# Patient Record
Sex: Male | Born: 1960 | Race: White | Hispanic: No | Smoking: Current every day smoker
Health system: Southern US, Community
[De-identification: ages and names within clinical notes are randomized; demographics above are authoritative.]

## PROBLEM LIST (undated history)

## (undated) DIAGNOSIS — M545 Low back pain, unspecified: Secondary | ICD-10-CM

## (undated) DIAGNOSIS — E042 Nontoxic multinodular goiter: Secondary | ICD-10-CM

## (undated) DIAGNOSIS — F419 Anxiety disorder, unspecified: Secondary | ICD-10-CM

## (undated) DIAGNOSIS — K769 Liver disease, unspecified: Secondary | ICD-10-CM

## (undated) DIAGNOSIS — I10 Essential (primary) hypertension: Secondary | ICD-10-CM

## (undated) DIAGNOSIS — F32A Depression, unspecified: Secondary | ICD-10-CM

## (undated) HISTORY — DX: Depression, unspecified: F32.A

## (undated) HISTORY — DX: Anxiety disorder, unspecified: F41.9

## (undated) HISTORY — DX: Nontoxic multinodular goiter: E04.2

## (undated) HISTORY — DX: Low back pain, unspecified: M54.50

---

## 2017-02-18 ENCOUNTER — Encounter (HOSPITAL_COMMUNITY): Payer: Self-pay | Admitting: Cardiology

## 2017-02-18 ENCOUNTER — Emergency Department (HOSPITAL_COMMUNITY)
Admission: EM | Admit: 2017-02-18 | Discharge: 2017-02-18 | Discharge status: Against Medical Advice | Payer: BC Managed Care – PPO | Attending: Emergency Medicine | Admitting: Emergency Medicine

## 2017-02-18 ENCOUNTER — Emergency Department (HOSPITAL_COMMUNITY): Payer: BC Managed Care – PPO

## 2017-02-18 DIAGNOSIS — I10 Essential (primary) hypertension: Secondary | ICD-10-CM | POA: Diagnosis not present

## 2017-02-18 DIAGNOSIS — R200 Anesthesia of skin: Secondary | ICD-10-CM | POA: Insufficient documentation

## 2017-02-18 DIAGNOSIS — Z5329 Procedure and treatment not carried out because of patient's decision for other reasons: Secondary | ICD-10-CM | POA: Insufficient documentation

## 2017-02-18 DIAGNOSIS — R51 Headache: Secondary | ICD-10-CM | POA: Insufficient documentation

## 2017-02-18 DIAGNOSIS — R55 Syncope and collapse: Secondary | ICD-10-CM | POA: Insufficient documentation

## 2017-02-18 DIAGNOSIS — R202 Paresthesia of skin: Secondary | ICD-10-CM | POA: Diagnosis not present

## 2017-02-18 DIAGNOSIS — F419 Anxiety disorder, unspecified: Secondary | ICD-10-CM | POA: Diagnosis not present

## 2017-02-18 DIAGNOSIS — R079 Chest pain, unspecified: Secondary | ICD-10-CM | POA: Insufficient documentation

## 2017-02-18 DIAGNOSIS — F17210 Nicotine dependence, cigarettes, uncomplicated: Secondary | ICD-10-CM | POA: Diagnosis not present

## 2017-02-18 DIAGNOSIS — R42 Dizziness and giddiness: Secondary | ICD-10-CM | POA: Insufficient documentation

## 2017-02-18 DIAGNOSIS — R002 Palpitations: Secondary | ICD-10-CM | POA: Insufficient documentation

## 2017-02-18 HISTORY — DX: Essential (primary) hypertension: I10

## 2017-02-18 LAB — COMPREHENSIVE METABOLIC PANEL
ALBUMIN: 4.1 g/dL (ref 3.5–5.0)
ALT: 26 U/L (ref 17–63)
ANION GAP: 10 (ref 5–15)
AST: 35 U/L (ref 15–41)
Alkaline Phosphatase: 36 U/L — ABNORMAL LOW (ref 38–126)
BUN: 7 mg/dL (ref 6–20)
CHLORIDE: 101 mmol/L (ref 101–111)
CO2: 24 mmol/L (ref 22–32)
Calcium: 9.3 mg/dL (ref 8.9–10.3)
Creatinine, Ser: 0.72 mg/dL (ref 0.61–1.24)
GFR calc Af Amer: >60 mL/min (ref >60)
GFR calc non Af Amer: >60 mL/min (ref >60)
GLUCOSE: 106 mg/dL — AB (ref 65–99)
POTASSIUM: 4.2 mmol/L (ref 3.5–5.1)
SODIUM: 135 mmol/L (ref 135–145)
Total Bilirubin: 0.6 mg/dL (ref 0.3–1.2)
Total Protein: 6.8 g/dL (ref 6.5–8.1)

## 2017-02-18 LAB — CBC WITH DIFFERENTIAL/PLATELET
Basophils Absolute: 0 10*3/uL (ref 0.0–0.1)
Basophils Relative: 1 %
EOS PCT: 2 %
Eosinophils Absolute: 0.1 10*3/uL (ref 0.0–0.7)
HEMATOCRIT: 41.1 % (ref 39.0–52.0)
HEMOGLOBIN: 14.5 g/dL (ref 13.0–17.0)
LYMPHS ABS: 1.1 10*3/uL (ref 0.7–4.0)
LYMPHS PCT: 26 %
MCH: 34.7 pg — AB (ref 26.0–34.0)
MCHC: 35.3 g/dL (ref 30.0–36.0)
MCV: 98.3 fL (ref 78.0–100.0)
Monocytes Absolute: 0.4 10*3/uL (ref 0.1–1.0)
Monocytes Relative: 9 %
Neutro Abs: 2.6 10*3/uL (ref 1.7–7.7)
Neutrophils Relative %: 62 %
PLATELETS: 196 10*3/uL (ref 150–400)
RBC: 4.18 MIL/uL — AB (ref 4.22–5.81)
RDW: 12.6 % (ref 11.5–15.5)
WBC: 4.2 10*3/uL (ref 4.0–10.5)

## 2017-02-18 LAB — RAPID URINE DRUG SCREEN, HOSP PERFORMED
AMPHETAMINES: NOT DETECTED
BARBITURATES: NOT DETECTED
Benzodiazepines: NOT DETECTED
Cocaine: NOT DETECTED
Opiates: NOT DETECTED
TETRAHYDROCANNABINOL: NOT DETECTED

## 2017-02-18 LAB — TROPONIN I: Troponin I: <0.03 ng/mL (ref <0.03)

## 2017-02-18 NOTE — ED Provider Notes (Signed)
[facility]-EMERGENCY DEPT Provider Note   CSN: [id number] Arrival date & time: 02/18/17  1004     History   Chief Complaint Chief Complaint  Patient presents with  . Chest Pain    HPI Clifford Hunt is a 56 y.o. male.  HPI Patient presents with chest pain. States over last week he had 2 episodes where he almost passed out. Both were however when he was working outside. Had lightheadedness and dizziness. Also has had chest pain. States his in his mid chest. It is dull. States it feels as if he got hit there but he has not had any trauma to the area. No personal history of cardiac disease but states his father had his first heart attack at 4238 and died at 1968. Patient has had a stress test but was 7 years ago. Also had some numbness and tingling in his left arm. States felt that potentially was not working quite as well. Also has had headaches. History of headaches. States it was somewhat worse recently. He does smoke. Past Medical History:  Diagnosis Date  . Hypertension     There are no active problems to display for this patient.   History reviewed. No pertinent surgical history.     Home Medications    Prior to Admission medications   Not on File    Family History History reviewed. No pertinent family history.  Social History Social History  Substance Use Topics  . Smoking status: Current Every Day Smoker    Packs/day: 1.00    Types: Cigarettes  . Smokeless tobacco: Never Used  . Alcohol use Yes     Comment: social     Allergies   Morphine and related   Review of Systems Review of Systems  Constitutional: Negative for appetite change and fever.  HENT: Negative for congestion.   Respiratory: Negative for shortness of breath.   Cardiovascular: Positive for chest pain.  Gastrointestinal: Negative for abdominal pain.  Genitourinary: Negative for flank pain.  Musculoskeletal: Negative for back pain.  Neurological: Positive for dizziness, numbness and  headaches.  Hematological: Negative for adenopathy.  Psychiatric/Behavioral: The patient is nervous/anxious.      Physical Exam Updated Vital Signs BP (!) 147/99 (BP Location: Left Arm)   Pulse 82   Temp 97.9 F (36.6 C) (Oral)   Resp 18   Ht 5\' 10"  (1.778 m)   Wt 85.7 kg (189 lb)   SpO2 97%   BMI 27.12 kg/m   Physical Exam  Constitutional: He appears well-developed.  HENT:  Head: Atraumatic.  Eyes: Pupils are equal, round, and reactive to light.  Neck: Neck supple.  Cardiovascular: Normal rate.   Pulmonary/Chest: He exhibits tenderness.  Tenderness on anterior mid upper chest. No deformity.  Abdominal: There is no tenderness.  Musculoskeletal:   good grip strength bilaterally. Sensation  grossly intact in both upper extremity's. Face symmetric.  Neurological: He is alert.  Skin: Skin is warm. Capillary refill takes less than 2 seconds.     ED Treatments / Results  Labs (all labs ordered are listed, but only abnormal results are displayed) Labs Reviewed  CBC WITH DIFFERENTIAL/PLATELET - Abnormal; Notable for the following:       Result Value   RBC 4.18 (*)    MCH 34.7 (*)    All other components within normal limits  COMPREHENSIVE METABOLIC PANEL - Abnormal; Notable for the following:    Glucose, Bld 106 (*)    Alkaline Phosphatase 36 (*)    All other  components within normal limits  RAPID URINE DRUG SCREEN, HOSP PERFORMED  TROPONIN I    EKG  EKG Interpretation  Date/Time:  Wednesday February 18 2017 10:16:03 EDT Ventricular Rate:  89 PR Interval:    QRS Duration: 96 QT Interval:  355 QTC Calculation: 432 R Axis:   72 Text Interpretation:  Sinus rhythm Baseline wander in lead(s) I II III aVR aVL aVF Confirmed by Benjiman Core [phone]) on 02/18/2017 10:20:31 AM       Radiology No results found.  Procedures Procedures (including critical care time)  Medications Ordered in ED Medications - No data to display   Initial Impression / Assessment  and Plan / ED Course  I have reviewed the triage vital signs and the nursing notes.  Pertinent labs & imaging results that were available during my care of the patient were reviewed by me and considered in my medical decision making (see chart for details).     Patient with palpitations. No real chest pain with it. No lightheadedness or dizziness. Mid chest. He is worried because of a strong family history. EKG x-ray and labs reassuring. Will however need cardiology. Will discharge home.  Final Clinical Impressions(s) / ED Diagnoses   Final diagnoses:  Chest pain, unspecified type    New Prescriptions There are no discharge medications for this patient.    Benjiman Core, MD 02/21/17 [phone]

## 2017-02-18 NOTE — ED Triage Notes (Signed)
Left arm numbness ,  Chest soreness times 2 days.  States he has had 2  near syncopal episodes in the  last week.

## 2017-02-18 NOTE — ED Notes (Signed)
EKG given to Dr Pickering 

## 2017-02-18 NOTE — ED Notes (Signed)
Pt made aware to return if symptoms worsen or if any life threatening symptoms occur.   

## 2017-02-18 NOTE — Discharge Instructions (Signed)
Take a baby aspirin every day. You're leaving against our advice. Feel free to return any time for further treatment. Follow-up as it is possible with your doctor.

## 2017-03-06 ENCOUNTER — Other Ambulatory Visit (HOSPITAL_COMMUNITY): Payer: Self-pay | Admitting: Family Medicine

## 2017-03-06 DIAGNOSIS — Z720 Tobacco use: Secondary | ICD-10-CM

## 2017-04-03 ENCOUNTER — Ambulatory Visit (HOSPITAL_COMMUNITY)
Admission: RE | Admit: 2017-04-03 | Discharge: 2017-04-03 | Disposition: A | Discharge status: Home / Self Care | Payer: BC Managed Care – PPO | Source: Ambulatory Visit | Attending: Family Medicine | Admitting: Family Medicine

## 2017-04-03 DIAGNOSIS — Z122 Encounter for screening for malignant neoplasm of respiratory organs: Secondary | ICD-10-CM | POA: Insufficient documentation

## 2017-04-03 DIAGNOSIS — E041 Nontoxic single thyroid nodule: Secondary | ICD-10-CM | POA: Diagnosis not present

## 2017-04-03 DIAGNOSIS — F17210 Nicotine dependence, cigarettes, uncomplicated: Secondary | ICD-10-CM | POA: Diagnosis present

## 2017-04-03 DIAGNOSIS — I700 Atherosclerosis of aorta: Secondary | ICD-10-CM | POA: Insufficient documentation

## 2017-04-03 DIAGNOSIS — Z720 Tobacco use: Secondary | ICD-10-CM

## 2017-04-16 ENCOUNTER — Other Ambulatory Visit (HOSPITAL_BASED_OUTPATIENT_CLINIC_OR_DEPARTMENT_OTHER): Payer: Self-pay

## 2017-04-16 DIAGNOSIS — R0683 Snoring: Secondary | ICD-10-CM

## 2017-04-16 DIAGNOSIS — G4730 Sleep apnea, unspecified: Secondary | ICD-10-CM

## 2017-04-16 DIAGNOSIS — G4710 Hypersomnia, unspecified: Secondary | ICD-10-CM

## 2017-04-16 DIAGNOSIS — R61 Generalized hyperhidrosis: Secondary | ICD-10-CM

## 2019-05-08 HISTORY — PX: ESOPHAGOGASTRODUODENOSCOPY: SHX1529

## 2019-05-08 HISTORY — PX: COLONOSCOPY: SHX174

## 2020-03-30 ENCOUNTER — Other Ambulatory Visit: Payer: Self-pay

## 2020-03-30 ENCOUNTER — Inpatient Hospital Stay (HOSPITAL_COMMUNITY)
Admission: EM | Admit: 2020-03-30 | Discharge: 2020-04-11 | DRG: 640 | Disposition: A | Discharge status: Skilled Nursing Facility | Payer: Medicare PPO | Attending: Family Medicine | Admitting: Family Medicine

## 2020-03-30 ENCOUNTER — Encounter (HOSPITAL_COMMUNITY): Payer: Self-pay | Admitting: *Deleted

## 2020-03-30 DIAGNOSIS — Z885 Allergy status to narcotic agent status: Secondary | ICD-10-CM | POA: Diagnosis not present

## 2020-03-30 DIAGNOSIS — D509 Iron deficiency anemia, unspecified: Secondary | ICD-10-CM | POA: Diagnosis present

## 2020-03-30 DIAGNOSIS — K828 Other specified diseases of gallbladder: Secondary | ICD-10-CM | POA: Diagnosis present

## 2020-03-30 DIAGNOSIS — D649 Anemia, unspecified: Secondary | ICD-10-CM

## 2020-03-30 DIAGNOSIS — U071 COVID-19: Secondary | ICD-10-CM | POA: Diagnosis present

## 2020-03-30 DIAGNOSIS — R7989 Other specified abnormal findings of blood chemistry: Secondary | ICD-10-CM | POA: Diagnosis present

## 2020-03-30 DIAGNOSIS — D696 Thrombocytopenia, unspecified: Secondary | ICD-10-CM

## 2020-03-30 DIAGNOSIS — R0602 Shortness of breath: Secondary | ICD-10-CM

## 2020-03-30 DIAGNOSIS — F1020 Alcohol dependence, uncomplicated: Secondary | ICD-10-CM | POA: Diagnosis present

## 2020-03-30 DIAGNOSIS — R269 Unspecified abnormalities of gait and mobility: Secondary | ICD-10-CM | POA: Diagnosis present

## 2020-03-30 DIAGNOSIS — Z79899 Other long term (current) drug therapy: Secondary | ICD-10-CM

## 2020-03-30 DIAGNOSIS — J90 Pleural effusion, not elsewhere classified: Secondary | ICD-10-CM | POA: Diagnosis present

## 2020-03-30 DIAGNOSIS — I10 Essential (primary) hypertension: Secondary | ICD-10-CM | POA: Diagnosis present

## 2020-03-30 DIAGNOSIS — L299 Pruritus, unspecified: Secondary | ICD-10-CM | POA: Diagnosis not present

## 2020-03-30 DIAGNOSIS — Z8249 Family history of ischemic heart disease and other diseases of the circulatory system: Secondary | ICD-10-CM

## 2020-03-30 DIAGNOSIS — E876 Hypokalemia: Secondary | ICD-10-CM | POA: Diagnosis not present

## 2020-03-30 DIAGNOSIS — E871 Hypo-osmolality and hyponatremia: Principal | ICD-10-CM

## 2020-03-30 DIAGNOSIS — R809 Proteinuria, unspecified: Secondary | ICD-10-CM | POA: Diagnosis present

## 2020-03-30 DIAGNOSIS — D61818 Other pancytopenia: Secondary | ICD-10-CM | POA: Diagnosis present

## 2020-03-30 DIAGNOSIS — K7031 Alcoholic cirrhosis of liver with ascites: Secondary | ICD-10-CM | POA: Diagnosis present

## 2020-03-30 DIAGNOSIS — F32A Depression, unspecified: Secondary | ICD-10-CM | POA: Diagnosis present

## 2020-03-30 DIAGNOSIS — J189 Pneumonia, unspecified organism: Secondary | ICD-10-CM

## 2020-03-30 DIAGNOSIS — F10139 Alcohol abuse with withdrawal, unspecified: Secondary | ICD-10-CM | POA: Diagnosis present

## 2020-03-30 DIAGNOSIS — E878 Other disorders of electrolyte and fluid balance, not elsewhere classified: Secondary | ICD-10-CM | POA: Diagnosis present

## 2020-03-30 DIAGNOSIS — F17210 Nicotine dependence, cigarettes, uncomplicated: Secondary | ICD-10-CM | POA: Diagnosis present

## 2020-03-30 DIAGNOSIS — R630 Anorexia: Secondary | ICD-10-CM | POA: Diagnosis present

## 2020-03-30 DIAGNOSIS — T458X5A Adverse effect of other primarily systemic and hematological agents, initial encounter: Secondary | ICD-10-CM | POA: Diagnosis not present

## 2020-03-30 DIAGNOSIS — F1010 Alcohol abuse, uncomplicated: Secondary | ICD-10-CM

## 2020-03-30 DIAGNOSIS — L500 Allergic urticaria: Secondary | ICD-10-CM | POA: Diagnosis not present

## 2020-03-30 DIAGNOSIS — K7030 Alcoholic cirrhosis of liver without ascites: Secondary | ICD-10-CM

## 2020-03-30 DIAGNOSIS — D508 Other iron deficiency anemias: Secondary | ICD-10-CM | POA: Diagnosis not present

## 2020-03-30 DIAGNOSIS — K769 Liver disease, unspecified: Secondary | ICD-10-CM

## 2020-03-30 HISTORY — DX: Liver disease, unspecified: K76.9

## 2020-03-30 LAB — CBC WITH DIFFERENTIAL/PLATELET
Abs Immature Granulocytes: 0.1 10*3/uL — ABNORMAL HIGH (ref 0.00–0.07)
Basophils Absolute: 0 10*3/uL (ref 0.0–0.1)
Basophils Relative: 0 %
Eosinophils Absolute: 0 10*3/uL (ref 0.0–0.5)
Eosinophils Relative: 0 %
HCT: 29.6 % — ABNORMAL LOW (ref 39.0–52.0)
Hemoglobin: 10.7 g/dL — ABNORMAL LOW (ref 13.0–17.0)
Immature Granulocytes: 1 %
Lymphocytes Relative: 10 %
Lymphs Abs: 0.7 10*3/uL (ref 0.7–4.0)
MCH: 39.2 pg — ABNORMAL HIGH (ref 26.0–34.0)
MCHC: 36.1 g/dL — ABNORMAL HIGH (ref 30.0–36.0)
MCV: 108.4 fL — ABNORMAL HIGH (ref 80.0–100.0)
Monocytes Absolute: 0.8 10*3/uL (ref 0.1–1.0)
Monocytes Relative: 11 %
Neutro Abs: 5.8 10*3/uL (ref 1.7–7.7)
Neutrophils Relative %: 78 %
Platelets: 121 10*3/uL — ABNORMAL LOW (ref 150–400)
RBC: 2.73 MIL/uL — ABNORMAL LOW (ref 4.22–5.81)
RDW: 12.2 % (ref 11.5–15.5)
WBC: 7.4 10*3/uL (ref 4.0–10.5)
nRBC: 0 % (ref 0.0–0.2)

## 2020-03-30 LAB — COMPREHENSIVE METABOLIC PANEL
ALT: 49 U/L — ABNORMAL HIGH (ref 0–44)
AST: 113 U/L — ABNORMAL HIGH (ref 15–41)
Albumin: 2.6 g/dL — ABNORMAL LOW (ref 3.5–5.0)
Alkaline Phosphatase: 142 U/L — ABNORMAL HIGH (ref 38–126)
Anion gap: 12 (ref 5–15)
BUN: 5 mg/dL — ABNORMAL LOW (ref 6–20)
CO2: 20 mmol/L — ABNORMAL LOW (ref 22–32)
Calcium: 8.1 mg/dL — ABNORMAL LOW (ref 8.9–10.3)
Chloride: 82 mmol/L — ABNORMAL LOW (ref 98–111)
Creatinine, Ser: 0.45 mg/dL — ABNORMAL LOW (ref 0.61–1.24)
GFR calc Af Amer: >60 mL/min (ref >60)
GFR calc non Af Amer: >60 mL/min (ref >60)
Glucose, Bld: 108 mg/dL — ABNORMAL HIGH (ref 70–99)
Potassium: 3.9 mmol/L (ref 3.5–5.1)
Sodium: 114 mmol/L — CL (ref 135–145)
Total Bilirubin: 2 mg/dL — ABNORMAL HIGH (ref 0.3–1.2)
Total Protein: 6.5 g/dL (ref 6.5–8.1)

## 2020-03-30 LAB — BASIC METABOLIC PANEL
Anion gap: 11 (ref 5–15)
Anion gap: 10 (ref 5–15)
BUN: 5 mg/dL — ABNORMAL LOW (ref 6–20)
BUN: 5 mg/dL — ABNORMAL LOW (ref 6–20)
CO2: 22 mmol/L (ref 22–32)
CO2: 22 mmol/L (ref 22–32)
Calcium: 7.8 mg/dL — ABNORMAL LOW (ref 8.9–10.3)
Calcium: 7.9 mg/dL — ABNORMAL LOW (ref 8.9–10.3)
Chloride: 86 mmol/L — ABNORMAL LOW (ref 98–111)
Chloride: 89 mmol/L — ABNORMAL LOW (ref 98–111)
Creatinine, Ser: 0.41 mg/dL — ABNORMAL LOW (ref 0.61–1.24)
Creatinine, Ser: 0.41 mg/dL — ABNORMAL LOW (ref 0.61–1.24)
GFR calc Af Amer: >60 mL/min (ref >60)
GFR calc Af Amer: >60 mL/min (ref >60)
GFR calc non Af Amer: >60 mL/min (ref >60)
GFR calc non Af Amer: >60 mL/min (ref >60)
Glucose, Bld: 93 mg/dL (ref 70–99)
Glucose, Bld: 104 mg/dL — ABNORMAL HIGH (ref 70–99)
Potassium: 3.5 mmol/L (ref 3.5–5.1)
Potassium: 3.5 mmol/L (ref 3.5–5.1)
Sodium: 119 mmol/L — CL (ref 135–145)
Sodium: 121 mmol/L — ABNORMAL LOW (ref 135–145)

## 2020-03-30 LAB — ETHANOL: Alcohol, Ethyl (B): 72 mg/dL — ABNORMAL HIGH (ref <10)

## 2020-03-30 LAB — LIPASE, BLOOD: Lipase: 40 U/L (ref 11–51)

## 2020-03-30 LAB — SARS CORONAVIRUS 2 BY RT PCR (HOSPITAL ORDER, PERFORMED IN ~~LOC~~ HOSPITAL LAB): SARS Coronavirus 2: NEGATIVE

## 2020-03-30 LAB — LACTIC ACID, PLASMA: Lactic Acid, Venous: 2.1 mmol/L (ref 0.5–1.9)

## 2020-03-30 LAB — PROTIME-INR
INR: 1.1 (ref 0.8–1.2)
Prothrombin Time: 13.4 seconds (ref 11.4–15.2)

## 2020-03-30 LAB — ACETAMINOPHEN LEVEL: Acetaminophen (Tylenol), Serum: <10 ug/mL — ABNORMAL LOW (ref 10–30)

## 2020-03-30 MED ORDER — THIAMINE HCL 100 MG/ML IJ SOLN: 100.0000 mg | Freq: Every day | INTRAMUSCULAR | Status: DC

## 2020-03-30 MED ORDER — THIAMINE HCL 100 MG/ML IJ SOLN
100.0000 mg | Freq: Every day | INTRAMUSCULAR | Status: DC
  Administered 2020-03-30: 100 mg via INTRAVENOUS
  Filled 2020-03-30 (×4): qty 2

## 2020-03-30 MED ORDER — ONDANSETRON HCL 4 MG PO TABS: 4.0000 mg | ORAL_TABLET | Freq: Four times a day (QID) | ORAL | Status: DC | PRN

## 2020-03-30 MED ORDER — PANTOPRAZOLE SODIUM 40 MG IV SOLR
40.0000 mg | INTRAVENOUS | Status: AC
  Administered 2020-03-30: 40 mg via INTRAVENOUS
  Filled 2020-03-30: qty 40

## 2020-03-30 MED ORDER — THIAMINE HCL 100 MG PO TABS
100.0000 mg | ORAL_TABLET | Freq: Every day | ORAL | Status: DC
  Administered 2020-03-31 – 2020-04-11 (×12): 100 mg via ORAL
  Filled 2020-03-30 (×12): qty 1

## 2020-03-30 MED ORDER — LORAZEPAM 1 MG PO TABS: 0.0000 mg | ORAL_TABLET | Freq: Two times a day (BID) | ORAL | Status: AC

## 2020-03-30 MED ORDER — PAROXETINE HCL 20 MG PO TABS
20.0000 mg | ORAL_TABLET | Freq: Every day | ORAL | Status: DC
  Administered 2020-03-31 – 2020-04-07 (×8): 20 mg via ORAL
  Filled 2020-03-30 (×8): qty 1

## 2020-03-30 MED ORDER — ORAL CARE MOUTH RINSE
15.0000 mL | Freq: Two times a day (BID) | OROMUCOSAL | Status: DC
  Administered 2020-03-30 – 2020-04-11 (×18): 15 mL via OROMUCOSAL

## 2020-03-30 MED ORDER — LORAZEPAM 2 MG/ML IJ SOLN
0.0000 mg | Freq: Two times a day (BID) | INTRAMUSCULAR | Status: AC
  Administered 2020-04-02: 2 mg via INTRAVENOUS
  Filled 2020-03-30: qty 1

## 2020-03-30 MED ORDER — ONDANSETRON HCL 4 MG/2ML IJ SOLN
4.0000 mg | Freq: Four times a day (QID) | INTRAMUSCULAR | Status: DC | PRN
  Administered 2020-04-04: 4 mg via INTRAVENOUS
  Filled 2020-03-30: qty 2

## 2020-03-30 MED ORDER — LORAZEPAM 1 MG PO TABS
0.0000 mg | ORAL_TABLET | Freq: Four times a day (QID) | ORAL | Status: AC
  Administered 2020-04-01: 1 mg via ORAL
  Filled 2020-03-30: qty 1

## 2020-03-30 MED ORDER — SODIUM CHLORIDE 0.9 % IV SOLN: INTRAVENOUS | Status: AC

## 2020-03-30 MED ORDER — NICOTINE 14 MG/24HR TD PT24
14.0000 mg | MEDICATED_PATCH | Freq: Every day | TRANSDERMAL | Status: DC
  Administered 2020-03-30 – 2020-04-11 (×13): 14 mg via TRANSDERMAL
  Filled 2020-03-30 (×13): qty 1

## 2020-03-30 MED ORDER — LORAZEPAM 2 MG/ML IJ SOLN
0.0000 mg | Freq: Four times a day (QID) | INTRAMUSCULAR | Status: AC
  Administered 2020-03-30: 19:00:00 1 mg via INTRAVENOUS
  Filled 2020-03-30: qty 1

## 2020-03-30 MED ORDER — FOLIC ACID 5 MG/ML IJ SOLN
1.0000 mg | Freq: Every day | INTRAMUSCULAR | Status: DC
  Administered 2020-03-30 – 2020-03-31 (×2): 1 mg via INTRAVENOUS
  Filled 2020-03-30 (×3): qty 0.2

## 2020-03-30 MED ORDER — SODIUM CHLORIDE 0.9 % IV SOLN: INTRAVENOUS | Status: DC

## 2020-03-30 MED ORDER — ENSURE ENLIVE PO LIQD
237.0000 mL | Freq: Two times a day (BID) | ORAL | Status: DC
  Administered 2020-03-31 – 2020-04-11 (×15): 237 mL via ORAL
  Filled 2020-03-30 (×5): qty 237

## 2020-03-30 NOTE — H&P (Signed)
History and Physical  Clifford Hunt EXH:[id number] DOB: Nov 09, 1960 DOA: 03/30/2020  Referring physician: Dr Hyacinth Meeker, ED physician PCP: [facility]  Outpatient Specialists:   Patient Coming From: home  Chief Complaint: abnormal lab  HPI: Clifford Hunt is a 59 y.o. male with a history of alcoholism with 5-6 beers a day with little to no oral food intake.  Patient was seen at [facility] and had blood work, which showed hyponatremia.  The patient was called by his PCPs office and instructed to come to the emergency room for evaluation.  He reports feeling weak with little to no energy.  No palliating or provoking factors.  His last alcohol consumption was earlier today: He had 2 beers prior to getting up.  He does report some nausea and lack of appetite.  He does have a longstanding history of foods not tasting right, which affects his appetite.  His symptoms are unchanged.  He does bruise easily, especially in his arms and hands.  He has no swelling in his legs.  Emergency Department Course: Sodium was 114 with a hypochloremia.  He was started on IV fluids at 250 mL/h.  He received 500 mL by the time I saw the patient.  Review of Systems:   Pt denies any fevers, chills, vomiting, diarrhea, constipation, abdominal pain, shortness of breath, dyspnea on exertion, orthopnea, cough, wheezing, palpitations, headache, vision changes, lightheadedness, dizziness, melena, rectal bleeding.  Review of systems are otherwise negative  Past Medical History:  Diagnosis Date  . Hypertension   . Liver damage    No past surgical history on file. Social History:  reports that he has been smoking cigarettes. He has been smoking about 1.00 pack per day. He has never used smokeless tobacco. He reports current alcohol use. He reports that he does not use drugs. Patient lives at home  Allergies  Allergen Reactions  . Morphine And Related Shortness Of Breath    No  family history on file.  Family history reviewed  Prior to Admission medications   Not on File    Physical Exam: BP (!) 142/79   Pulse 97   Temp 97.7 F (36.5 C)   Resp 15   Ht 5\' 10"  (1.778 m)   Wt 62.6 kg   SpO2 100%   BMI 19.80 kg/m   . General: Adult male who appears older than stated age. Awake and alert and oriented x3. No acute cardiopulmonary distress.  HEENT: Normocephalic atraumatic.  Right and left ears normal in appearance.  Pupils equal, round, reactive to light. Extraocular muscles are intact. Sclerae anicteric and noninjected.  Moist mucosal membranes. No mucosal lesions.  . Neck: Neck supple without lymphadenopathy. No carotid bruits. No masses palpated.  . Cardiovascular: Regular rate with normal S1-S2 sounds. No murmurs, rubs, gallops auscultated. No JVD.  Marland Kitchen Respiratory: Good respiratory effort with no wheezes, rales, rhonchi. Lungs clear to auscultation bilaterally.  No accessory muscle use. . Abdomen: Soft, nontender, nondistended. Active bowel sounds. No masses.  Does have enlarged liver spanning 3 to 4 fingerbreadths past the costal margin. . Skin: Significant bruising to the upper extremities bilaterally.  No rashes, lesions, or ulcerations.  Dry, warm to touch. 2+ dorsalis pedis and radial pulses. . Musculoskeletal: No calf or leg pain. All major joints not erythematous nontender.  No upper or lower joint deformation.  Good ROM.  No contractures  . Psychiatric: Intact judgment and insight. Pleasant and cooperative. . Neurologic: No focal neurological deficits.  Strength is 5/5 and symmetric in upper and lower extremities.  Cranial nerves II through XII are grossly intact.           Labs on Admission: I have personally reviewed following labs and imaging studies  CBC: Recent Labs  Lab 03/30/20 1256  WBC 7.4  NEUTROABS 5.8  HGB 10.7*  HCT 29.6*  MCV 108.4*  PLT 121*   Basic Metabolic Panel: Recent Labs  Lab 03/30/20 1256 03/30/20 1536  NA 114*  119*  K 3.9 3.5  CL 82* 86*  CO2 20* 22  GLUCOSE 108* 93  BUN 5* 5*  CREATININE 0.45* 0.41*  CALCIUM 8.1* 7.8*   GFR: Estimated Creatinine Clearance: 89.1 mL/min (A) (by C-G formula based on SCr of 0.41 mg/dL (L)). Liver Function Tests: Recent Labs  Lab 03/30/20 1256  AST 113*  ALT 49*  ALKPHOS 142*  BILITOT 2.0*  PROT 6.5  ALBUMIN 2.6*   Recent Labs  Lab 03/30/20 1256  LIPASE 40   No results for input(s): AMMONIA in the last 168 hours. Coagulation Profile: No results for input(s): INR, PROTIME in the last 168 hours. Cardiac Enzymes: No results for input(s): CKTOTAL, CKMB, CKMBINDEX, TROPONINI in the last 168 hours. BNP (last 3 results) No results for input(s): PROBNP in the last 8760 hours. HbA1C: No results for input(s): HGBA1C in the last 72 hours. CBG: No results for input(s): GLUCAP in the last 168 hours. Lipid Profile: No results for input(s): CHOL, HDL, LDLCALC, TRIG, CHOLHDL, LDLDIRECT in the last 72 hours. Thyroid Function Tests: No results for input(s): TSH, T4TOTAL, FREET4, T3FREE, THYROIDAB in the last 72 hours. Anemia Panel: No results for input(s): VITAMINB12, FOLATE, FERRITIN, TIBC, IRON, RETICCTPCT in the last 72 hours. Urine analysis: No results found for: COLORURINE, APPEARANCEUR, LABSPEC, PHURINE, GLUCOSEU, HGBUR, BILIRUBINUR, KETONESUR, PROTEINUR, UROBILINOGEN, NITRITE, LEUKOCYTESUR Sepsis Labs: @LABRCNTIP (procalcitonin:4,lacticidven:4) ) Recent Results (from the past 240 hour(s))  SARS Coronavirus 2 by RT PCR (hospital order, performed in [facility] hospital lab) Nasopharyngeal Nasopharyngeal Swab     Status: None   Collection Time: 03/30/20  1:37 PM   Specimen: Nasopharyngeal Swab  Result Value Ref Range Status   SARS Coronavirus 2 NEGATIVE NEGATIVE Final    Comment: (NOTE) SARS-CoV-2 target nucleic acids are NOT DETECTED.  The SARS-CoV-2 RNA is generally detectable in upper and lower respiratory specimens during the acute phase of  infection. The lowest concentration of SARS-CoV-2 viral copies this assay can detect is 250 copies / mL. A negative result does not preclude SARS-CoV-2 infection and should not be used as the sole basis for treatment or other patient management decisions.  A negative result may occur with improper specimen collection / handling, submission of specimen other than nasopharyngeal swab, presence of viral mutation(s) within the areas targeted by this assay, and inadequate number of viral copies (<250 copies / mL). A negative result must be combined with clinical observations, patient history, and epidemiological information.  Fact Sheet for Patients:   04/01/20  Fact Sheet for Healthcare Providers: BoilerBrush.com.cy  This test is not yet approved or  cleared by the https://pope.com/ FDA and has been authorized for detection and/or diagnosis of SARS-CoV-2 by FDA under an Emergency Use Authorization (EUA).  This EUA will remain in effect (meaning this test can be used) for the duration of the COVID-19 declaration under Section 564(b)(1) of the Act, 21 U.S.C. section 360bbb-3(b)(1), unless the authorization is terminated or revoked sooner.  Performed at [facility], [address], [place], [place]  [address]      Radiological Exams on Admission: No results found.  EKG: Independently reviewed.  Sinus tachycardia with PVCs.  No acute ST changes.  Assessment/Plan: Principal Problem:   Acute hyponatremia Active Problems:   Elevated LFTs   Alcohol abuse   Anemia, iron deficiency    This patient was discussed with the ED physician, including pertinent vitals, physical exam findings, labs, and imaging.  We also discussed care given by the ED provider.  1. Acute hyponatremia a. Secondary to beer portal mania. b. Check urine sodium, urine creatinine, urine osmolality c. We will hold IV fluids after another 500 mL given at 100  mL/h. d. Fluid restrict e. Check sodium every 4 hours 2. Alcohol abuse a. Alcohol withdrawals protocol 3. Elevated LFTs a. 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 Right upper quadrant ultrasound c. Check INR which is likely elevated as exhibited by the patient's easy bruising 4. Anemia a. Secondary to chronic alcohol use and minimal oral intake.  DVT prophylaxis: SCDs for now until INR returns Consultants: None Code Status: Full code Family Communication: Fianc present during interview and exam Disposition Plan: Patient should be able to return home   Levie Heritage, DO

## 2020-03-30 NOTE — ED Triage Notes (Signed)
Sent for evaluation of labs. Chronic alcohol abuse

## 2020-03-30 NOTE — ED Provider Notes (Signed)
[facility] EMERGENCY DEPARTMENT Provider Note   CSN: [id number] Arrival date & time: 03/30/20  1206     History Chief Complaint  Patient presents with  . Abnormal Lab    Clifford Hunt is a 59 y.o. male.  HPI   Pt is a 59 y/o male, the patient states he only takes Paxil, he is a daily alcoholic, drinks heavy amounts of alcohol, has already had two beers this morning.  He is followed by [facility], he had lab work done recently and was told something was abnormal and that he needed to come to the hospital.  He is unaware of what that specific abnormality was.  He denies nausea or vomiting but feels generally weak.  Denies diarrhea or rectal bleeding.  The history is a little vague since the patient does not know why he is here only that he was told to come because of abnormal blood work.  Past Medical History:  Diagnosis Date  . Hypertension   . Liver damage     There are no problems to display for this patient.   No past surgical history on file.     No family history on file.  Social History   Tobacco Use  . Smoking status: Current Every Day Smoker    Packs/day: 1.00    Types: Cigarettes  . Smokeless tobacco: Never Used  Substance Use Topics  . Alcohol use: Yes    Comment: social  . Drug use: No    Home Medications Prior to Admission medications   Not on File    Allergies    Morphine and related  Review of Systems   Review of Systems  All other systems reviewed and are negative.   Physical Exam Updated Vital Signs BP (!) 142/79   Pulse 97   Temp 97.7 F (36.5 C)   Resp 15   Ht 1.778 m (5\' 10" )   Wt 62.6 kg   SpO2 100%   BMI 19.80 kg/m   Physical Exam Vitals and nursing note reviewed.  Constitutional:      General: He is not in acute distress.    Appearance: He is well-developed.  HENT:     Head: Normocephalic and atraumatic.     Mouth/Throat:     Mouth: Mucous membranes are moist.     Pharynx: No oropharyngeal exudate.   Eyes:     General: Scleral icterus present.        Right eye: No discharge.        Left eye: No discharge.     Pupils: Pupils are equal, round, and reactive to light.  Neck:     Thyroid: No thyromegaly.     Vascular: No JVD.  Cardiovascular:     Rate and Rhythm: Regular rhythm. Tachycardia present.     Heart sounds: Normal heart sounds. No murmur heard.  No friction rub. No gallop.   Pulmonary:     Effort: Pulmonary effort is normal. No respiratory distress.     Breath sounds: Normal breath sounds. No wheezing or rales.  Abdominal:     General: Bowel sounds are normal. There is no distension.     Palpations: Abdomen is soft. There is no mass.     Tenderness: There is no abdominal tenderness.  Musculoskeletal:        General: No tenderness. Normal range of motion.     Cervical back: Normal range of motion and neck supple.     Comments: Diffuse muscle wasting, no  edema of the legs  Lymphadenopathy:     Cervical: No cervical adenopathy.  Skin:    General: Skin is warm and dry.     Findings: No erythema or rash.  Neurological:     Mental Status: He is alert.     Coordination: Coordination normal.     Comments: The patient is diffusely weak, leaning over in the chair, able to move all four extremities but slowly  Psychiatric:        Behavior: Behavior normal.     ED Results / Procedures / Treatments   Labs (all labs ordered are listed, but only abnormal results are displayed) Labs Reviewed  COMPREHENSIVE METABOLIC PANEL - Abnormal; Notable for the following components:      Result Value   Sodium 114 (*)    Chloride 82 (*)    CO2 20 (*)    Glucose, Bld 108 (*)    BUN 5 (*)    Creatinine, Ser 0.45 (*)    Calcium 8.1 (*)    Albumin 2.6 (*)    AST 113 (*)    ALT 49 (*)    Alkaline Phosphatase 142 (*)    Total Bilirubin 2.0 (*)    All other components within normal limits  LACTIC ACID, PLASMA - Abnormal; Notable for the following components:   Lactic Acid, Venous 2.1  (*)    All other components within normal limits  ETHANOL - Abnormal; Notable for the following components:   Alcohol, Ethyl (B) 72 (*)    All other components within normal limits  CBC WITH DIFFERENTIAL/PLATELET - Abnormal; Notable for the following components:   RBC 2.73 (*)    Hemoglobin 10.7 (*)    HCT 29.6 (*)    MCV 108.4 (*)    MCH 39.2 (*)    MCHC 36.1 (*)    Platelets 121 (*)    Abs Immature Granulocytes 0.10 (*)    All other components within normal limits  ACETAMINOPHEN LEVEL - Abnormal; Notable for the following components:   Acetaminophen (Tylenol), Serum <10 (*)    All other components within normal limits  SARS CORONAVIRUS 2 BY RT PCR (HOSPITAL ORDER, PERFORMED IN [facility] LAB)  LIPASE, BLOOD  HEPATITIS PANEL, ACUTE    EKG EKG Interpretation  Date/Time:  Friday March 30 2020 12:30:30 EDT Ventricular Rate:  110 PR Interval:  132 QRS Duration: 78 QT Interval:  330 QTC Calculation: 446 R Axis:   104 Text Interpretation: Sinus tachycardia with Premature supraventricular complexes Rightward axis Borderline ECG Since last tracing rate faster and axis is now rightward Confirmed by Eber Hong ([id number]) on 03/30/2020 12:46:53 PM   Radiology No results found.  Procedures .Critical Care Performed by: Eber Hong, MD Authorized by: Eber Hong, MD   Critical care provider statement:    Critical care time (minutes):  35   Critical care time was exclusive of:  Separately billable procedures and treating other patients and teaching time   Critical care was necessary to treat or prevent imminent or life-threatening deterioration of the following conditions:  Metabolic crisis   Critical care was time spent personally by me on the following activities:  Blood draw for specimens, development of treatment plan with patient or surrogate, discussions with consultants, evaluation of patient's response to treatment, examination of patient, obtaining history  from patient or surrogate, ordering and performing treatments and interventions, ordering and review of laboratory studies, ordering and review of radiographic studies, pulse oximetry, re-evaluation of patient's condition and  review of old charts   (including critical care time)  Medications Ordered in ED Medications  0.9 %  sodium chloride infusion ( Intravenous New Bag/Given 03/30/20 1335)  LORazepam (ATIVAN) injection 0-4 mg (0 mg Intravenous Not Given 03/30/20 1337)    Or  LORazepam (ATIVAN) tablet 0-4 mg ( Oral See Alternative 03/30/20 1337)  LORazepam (ATIVAN) injection 0-4 mg (has no administration in time range)    Or  LORazepam (ATIVAN) tablet 0-4 mg (has no administration in time range)  thiamine tablet 100 mg (has no administration in time range)    Or  thiamine (B-1) injection 100 mg (has no administration in time range)  pantoprazole (PROTONIX) injection 40 mg (has no administration in time range)    ED Course  I have reviewed the triage vital signs and the nursing notes.  Pertinent labs & imaging results that were available during my care of the patient were reviewed by me and considered in my medical decision making (see chart for details).  Clinical Course as of Mar 30 1530  Fri Mar 30, 2020  1323 Hemoglobin(!): 10.7 [BM]  1323 WBC: 7.4 [BM]  1323 Patient likely has thrombocytopenia related to chronic alcohol use as is the anemia.  Last hemoglobin was over 14 checked several years ago.  Would not be surprised if there was some alcoholic gastritis, will need to have stools hemocculted, Protonix ordered  Platelets(!): 121 [BM]  1347 Sodium(!!): 114 [BM]  1347 Chloride(!): 82 [BM]  1347 Albumin(!): 2.6 [BM]  1347 AST(!): 113 [BM]  1347 ALT(!): 49 [BM]  1347 Total Bilirubin(!): 2.0 [BM]  1347 Alcohol, Ethyl (B)(!): 72 [BM]  1347 Hemoglobin(!): 10.7 [BM]  1347 Acetaminophen (Tylenol), S(!): <10 [BM]  1347 Lactic Acid, Venous(!!): 2.1 [BM]    Clinical Course User  Index [BM] Eber Hong, MD   MDM Rules/Calculators/A&P                          This patient is a chronic alcoholic who presents with signs of jaundice, some degree of failure to thrive and abnormal labs of which he is unaware.  I suspect he has some liver dysfunction likely from chronic alcohol use, he will need labs, fluids, likely admission to the hospital, patient is agreeable to the plan  Pt is now in a room - has an IV - CIWA has been ordered to prevent potential withdrawal - he is tachycardic, and now reportedly told he was hyponatremic - ,labs pending.  No seizures.  Patient has a sodium of 114 and a glucose of 82, his liver function tests Matcha history of alcohol abuse.  The patient is getting IV fluids, he will be placed on a cardiac monitor, he is critically ill with severe hyponatremia and will need to make sure he does not have any seizure-like activity.  At this time he has not yet had that.  We will consult with hospitalist for admission.  Discussed with Dr. Gust Rung who will admit  Samad Thon was evaluated in Emergency Department on 03/30/2020 for the symptoms described in the history of present illness. He was evaluated in the context of the global COVID-19 pandemic, which necessitated consideration that the patient might be at risk for infection with the SARS-CoV-2 virus that causes COVID-19. Institutional protocols and algorithms that pertain to the evaluation of patients at risk for COVID-19 are in a state of rapid change based on information released by regulatory bodies including the CDC and federal and  state organizations. These policies and algorithms were followed during the patient's care in the ED.   Final Clinical Impression(s) / ED Diagnoses Final diagnoses:  Anemia, unspecified type  Thrombocytopenia (HCC)  Alcohol abuse  Hyponatremia      Eber HongMiller, Nyko Gell, MD 03/30/20 1531

## 2020-03-30 NOTE — ED Notes (Signed)
Pt incontinent of stool, pt cleaned, family at bedside,

## 2020-03-30 NOTE — ED Notes (Signed)
CRITICAL VALUE ALERT  Critical Value:  Lactic Acid is 2.2 & Sodium is 114  Date & Time Notied:  03/30/20 & 1330  Provider Notified: EDP  Orders Received/Actions taken: notified

## 2020-03-31 ENCOUNTER — Inpatient Hospital Stay (HOSPITAL_COMMUNITY): Payer: Medicare PPO

## 2020-03-31 DIAGNOSIS — E871 Hypo-osmolality and hyponatremia: Principal | ICD-10-CM

## 2020-03-31 LAB — HEPATIC FUNCTION PANEL
ALT: 44 U/L (ref 0–44)
AST: 111 U/L — ABNORMAL HIGH (ref 15–41)
Albumin: 2.2 g/dL — ABNORMAL LOW (ref 3.5–5.0)
Alkaline Phosphatase: 118 U/L (ref 38–126)
Bilirubin, Direct: 1.1 mg/dL — ABNORMAL HIGH (ref 0.0–0.2)
Indirect Bilirubin: 0.8 mg/dL (ref 0.3–0.9)
Total Bilirubin: 1.9 mg/dL — ABNORMAL HIGH (ref 0.3–1.2)
Total Protein: 5.4 g/dL — ABNORMAL LOW (ref 6.5–8.1)

## 2020-03-31 LAB — BASIC METABOLIC PANEL
Anion gap: 9 (ref 5–15)
Anion gap: 9 (ref 5–15)
BUN: 5 mg/dL — ABNORMAL LOW (ref 6–20)
BUN: 6 mg/dL (ref 6–20)
CO2: 22 mmol/L (ref 22–32)
CO2: 25 mmol/L (ref 22–32)
Calcium: 7.5 mg/dL — ABNORMAL LOW (ref 8.9–10.3)
Calcium: 7.9 mg/dL — ABNORMAL LOW (ref 8.9–10.3)
Chloride: 89 mmol/L — ABNORMAL LOW (ref 98–111)
Chloride: 88 mmol/L — ABNORMAL LOW (ref 98–111)
Creatinine, Ser: 0.39 mg/dL — ABNORMAL LOW (ref 0.61–1.24)
Creatinine, Ser: 0.43 mg/dL — ABNORMAL LOW (ref 0.61–1.24)
GFR calc Af Amer: >60 mL/min (ref >60)
GFR calc Af Amer: >60 mL/min (ref >60)
GFR calc non Af Amer: >60 mL/min (ref >60)
GFR calc non Af Amer: >60 mL/min (ref >60)
Glucose, Bld: 98 mg/dL (ref 70–99)
Glucose, Bld: 90 mg/dL (ref 70–99)
Potassium: 3.4 mmol/L — ABNORMAL LOW (ref 3.5–5.1)
Potassium: 3.3 mmol/L — ABNORMAL LOW (ref 3.5–5.1)
Sodium: 120 mmol/L — ABNORMAL LOW (ref 135–145)
Sodium: 122 mmol/L — ABNORMAL LOW (ref 135–145)

## 2020-03-31 LAB — RETICULOCYTES
Immature Retic Fract: 18.1 % — ABNORMAL HIGH (ref 2.3–15.9)
RBC.: 2.48 MIL/uL — ABNORMAL LOW (ref 4.22–5.81)
Retic Count, Absolute: 105.9 10*3/uL (ref 19.0–186.0)
Retic Ct Pct: 4.3 % — ABNORMAL HIGH (ref 0.4–3.1)

## 2020-03-31 LAB — PROTIME-INR
INR: 1.1 (ref 0.8–1.2)
Prothrombin Time: 13.9 seconds (ref 11.4–15.2)

## 2020-03-31 LAB — HIV ANTIBODY (ROUTINE TESTING W REFLEX): HIV Screen 4th Generation wRfx: NONREACTIVE

## 2020-03-31 LAB — HEPATITIS PANEL, ACUTE
HCV Ab: NONREACTIVE
Hep A IgM: NONREACTIVE
Hep B C IgM: NONREACTIVE
Hepatitis B Surface Ag: NONREACTIVE

## 2020-03-31 LAB — CBC
HCT: 25.3 % — ABNORMAL LOW (ref 39.0–52.0)
Hemoglobin: 8.9 g/dL — ABNORMAL LOW (ref 13.0–17.0)
MCH: 38.9 pg — ABNORMAL HIGH (ref 26.0–34.0)
MCHC: 35.2 g/dL (ref 30.0–36.0)
MCV: 110.5 fL — ABNORMAL HIGH (ref 80.0–100.0)
Platelets: 86 10*3/uL — ABNORMAL LOW (ref 150–400)
RBC: 2.29 MIL/uL — ABNORMAL LOW (ref 4.22–5.81)
RDW: 12.2 % (ref 11.5–15.5)
WBC: 4.1 10*3/uL (ref 4.0–10.5)
nRBC: 0 % (ref 0.0–0.2)

## 2020-03-31 LAB — FERRITIN: Ferritin: 1418 ng/mL — ABNORMAL HIGH (ref 24–336)

## 2020-03-31 LAB — IRON AND TIBC
Iron: 86 ug/dL (ref 45–182)
Saturation Ratios: 53 % — ABNORMAL HIGH (ref 17.9–39.5)
TIBC: 163 ug/dL — ABNORMAL LOW (ref 250–450)
UIBC: 77 ug/dL

## 2020-03-31 LAB — FOLATE: Folate: 7.3 ng/mL (ref >5.9)

## 2020-03-31 LAB — VITAMIN B12: Vitamin B-12: 840 pg/mL (ref 180–914)

## 2020-03-31 MED ORDER — METOPROLOL TARTRATE 5 MG/5ML IV SOLN: 10.0000 mg | Freq: Four times a day (QID) | INTRAVENOUS | Status: DC | PRN

## 2020-03-31 MED ORDER — CHLORDIAZEPOXIDE HCL 5 MG PO CAPS
5.0000 mg | ORAL_CAPSULE | Freq: Three times a day (TID) | ORAL | Status: DC
  Administered 2020-03-31 – 2020-04-09 (×27): 5 mg via ORAL
  Filled 2020-03-31 (×29): qty 1

## 2020-03-31 MED ORDER — ASCORBIC ACID 500 MG PO TABS
500.0000 mg | ORAL_TABLET | Freq: Two times a day (BID) | ORAL | Status: DC
  Administered 2020-03-31 – 2020-04-11 (×23): 500 mg via ORAL
  Filled 2020-03-31 (×23): qty 1

## 2020-03-31 MED ORDER — ADULT MULTIVITAMIN W/MINERALS CH
1.0000 | ORAL_TABLET | Freq: Every day | ORAL | Status: DC
  Administered 2020-03-31 – 2020-04-11 (×12): 1 via ORAL
  Filled 2020-03-31 (×12): qty 1

## 2020-03-31 MED ORDER — ALUM & MAG HYDROXIDE-SIMETH 200-200-20 MG/5ML PO SUSP
30.0000 mL | ORAL | Status: DC | PRN
  Administered 2020-03-31 – 2020-04-04 (×2): 30 mL via ORAL
  Filled 2020-03-31 (×2): qty 30

## 2020-03-31 MED ORDER — POTASSIUM CHLORIDE CRYS ER 20 MEQ PO TBCR
40.0000 meq | EXTENDED_RELEASE_TABLET | Freq: Once | ORAL | Status: AC
  Administered 2020-03-31: 40 meq via ORAL
  Filled 2020-03-31: qty 2

## 2020-03-31 NOTE — Progress Notes (Signed)
Initial Nutrition Assessment  DOCUMENTATION CODES:   Not applicable  INTERVENTION:  Continue Ensure Enlive po BID, each supplement provides 350 kcal and 20 grams of protein  MVI with minerals daily  Vitamin C 500 mg po BID  Pt is at high risk for refeeding given chronic poor intake of nutrition secondary to alcohol abuse and electrolyte abnormalities, recommend monitoring K, Mg and P daily as po intake improves    NUTRITION DIAGNOSIS:   Inadequate oral intake related to poor appetite, social / environmental circumstances (chronic alcohol abuse) as evidenced by per patient/family report.  GOAL:   Patient will meet greater than or equal to 90% of their needs  MONITOR:   Labs, PO intake, Weight trends, Supplement acceptance, Skin  REASON FOR ASSESSMENT:   Malnutrition Screening Tool    ASSESSMENT:  RD working remotely.   59 year old male with history of HTN, liver damage and daily alcohol use presented due to abnormal labs and reports generalized weakness, nausea, and lack of appetite.  Patient admitted with acute hyponatremia with serum sodium of 114.  RD attempted to contact pt this afternoon via inpt room phone, however he did not pick up. Per notes pt endorsed ongoing tobacco use, drinking 5-6 beers daily and had consumed 2 beers prior to presenting to ED. He reports little to no oral food intake with history of altered taste that affects his appetite. Pt reports that he bruises easily, ecchymosis to bilateral arms, flank, buttocks, and back noted per RN assessment. Given very poor oral intake, chronic alcohol/tobacco abuse as well as ecchymosis, highly suspect degree of malnutrition as well as vitamin C deficiency. Will perform exam and obtain history at follow-up.   Pt ordered a regular diet, no documented meals for review. He is receiving Ensure BID, will continue this and will order Vit C as well as daily MVI. He is at high risk for refeeding, recommend monitoring  magnesium, potassium, and phosphorus daily as po intake improves, MD to replete as needed.  Current wt 137.72 lbs, Moderate BLE edema noted No recent wt history for review, noted 85.7 (188.54 lbs) kg on 02/18/17  I/Os: +324.7 ml since admit UOP: 700 ml x 24 hrs  Medications reviewed and include: Librium, Folic acid, Ativan, Thiamine  Labs: Na 122 (L) replacing K 3.3 (L) replacing Cr 0.43 (L), Hgb 8.9 (L) HCT 25.3 (L), RBC 2.29 (L)   NUTRITION - FOCUSED PHYSICAL EXAM: Unable to complete to complete at this time, RD working remotely.  Diet Order:   Diet Order            Diet regular Room service appropriate? Yes; Fluid consistency: Thin; Fluid restriction: 1200 mL Fluid  Diet effective now                 EDUCATION NEEDS:   Not appropriate for education at this time  Skin:  Skin Assessment: Skin Integrity Issues: Skin Integrity Issues:: Other (Comment) Other: ecchymosis; bilateral arm;flank;buttocks;back  Last BM:  9/24  Height:   Ht Readings from Last 1 Encounters:  03/30/20 5\' 10"  (1.778 m)    Weight:   Wt Readings from Last 1 Encounters:  03/30/20 62.6 kg    Ideal Body Weight:  75.5 kg  BMI:  Body mass index is 19.8 kg/m.  Estimated Nutritional Needs:   Kcal:  1900-2100  Protein:  95-105  Fluid:  >/= 1.9 L/day   04/01/20, RD, LDN Clinical Nutrition After Hours/Weekend Pager # in Amion

## 2020-03-31 NOTE — Progress Notes (Signed)
PROGRESS NOTE    Clifford Hunt  YWV:[id number] DOB: 01-Apr-1961 DOA: 03/30/2020 PCP: [facility]   Brief Narrative:  Per HPI: Clifford Hunt is a 59 y.o. male with a history of alcoholism with 5-6 beers a day with little to no oral food intake.  Patient was seen at [facility] and had blood work, which showed hyponatremia.  The patient was called by his PCPs office and instructed to come to the emergency room for evaluation.  He reports feeling weak with little to no energy.  No palliating or provoking factors.  His last alcohol consumption was earlier today: He had 2 beers prior to getting up.  He does report some nausea and lack of appetite.  He does have a longstanding history of foods not tasting right, which affects his appetite.  His symptoms are unchanged.  He does bruise easily, especially in his arms and hands.  He has no swelling in his legs.  9/25: Patient was admitted with acute hyponatremia with serum sodium of 114 in the setting of beer Poto mania.  This was noted on blood work as noted above.  He was noted to have some nausea and change in appetite.  His sodium levels have improved to 122 this morning and further IV fluid will be held and he will be maintained on fluid restriction for now with repeat labs in a.m.  2D echocardiogram ordered and pending.  Potassium will be supplemented.  Assessment & Plan:   Principal Problem:   Acute hyponatremia Active Problems:   Elevated LFTs   Alcohol abuse   Anemia, iron deficiency   Hyponatremia acute versus chronic-improving -Likely related to beer potomania -Continue fluid restriction and avoid further IV fluid as sodium is currently 122 -Recheck labs in a.m.  History of alcohol abuse -Continue alcohol withdrawal protocol with Librium ordered twice daily -Patient has been reluctant to pursue alcohol counseling in the past.  Transaminitis -Likely secondary to alcohol abuse -Right  upper quadrant ultrasound ordered and pending -Recheck labs in a.m.  Anemia/thrombocytopenia-downward trend -Likely secondary to poor nutrition and ongoing chronic alcohol abuse -Anemia panel ordered -Recheck labs in a.m. to follow trend  Ongoing tobacco abuse -Nicotine patch -Counseled on cessation  Ambulatory dysfunction -Likely related to chronic alcohol use -Fall precautions -PT evaluation  DVT prophylaxis:SCDs Code Status: Full Family Communication: Discussed with fianc on phone 9/25 Disposition Plan:  Status is: Inpatient  Remains inpatient appropriate because:IV treatments appropriate due to intensity of illness or inability to take PO and Inpatient level of care appropriate due to severity of illness   Dispo: The patient is from: Home              Anticipated d/c is to: Home              Anticipated d/c date is: 1 day              Patient currently is not medically stable to d/c.  Consultants:   None  Procedures:   See below  Antimicrobials:   None   Subjective: Patient seen and evaluated today with and is currently undergoing 2D echocardiogram.  He denies any complaints or concerns.  He was noted to be tachycardic this morning.  Objective: Vitals:   03/30/20 1821 03/30/20 2313 03/31/20 0157 03/31/20 0457  BP: (!) 128/107 112/84 123/88 (!) 114/59  Pulse: (!) 106 (!) 108 (!) 108 100  Resp: 18 15 16 16   Temp: 98 F (36.7 C)  99.1 F (37.3 C) 99.1 F (37.3 C) 97.6 F (36.4 C)  TempSrc: Oral   Oral  SpO2: 100% 99% 99% 96%  Weight:      Height:        Intake/Output Summary (Last 24 hours) at 03/31/2020 1106 Last data filed at 03/30/2020 1826 Gross per 24 hour  Intake 1024.67 ml  Output 700 ml  Net 324.67 ml   Filed Weights   03/30/20 1228  Weight: 62.6 kg    Examination:  General exam: Appears calm and comfortable  Respiratory system: Clear to auscultation. Respiratory effort normal. Cardiovascular system: S1 & S2 heard,  RRR. Gastrointestinal system: Abdomen is nondistended, soft and nontender.  Central nervous system: Alert and awake Extremities: Symmetric 5 x 5 power. Skin: No rashes, lesions or ulcers Psychiatry: Flat affect    Data Reviewed: I have personally reviewed following labs and imaging studies  CBC: Recent Labs  Lab 03/30/20 1256 03/31/20 0728  WBC 7.4 4.1  NEUTROABS 5.8  --   HGB 10.7* 8.9*  HCT 29.6* 25.3*  MCV 108.4* 110.5*  PLT 121* 86*   Basic Metabolic Panel: Recent Labs  Lab 03/30/20 1256 03/30/20 1536 03/30/20 1934 03/31/20 0130 03/31/20 0728  NA 114* 119* 121* 120* 122*  K 3.9 3.5 3.5 3.4* 3.3*  CL 82* 86* 89* 89* 88*  CO2 20* 22 22 22 25   GLUCOSE 108* 93 104* 98 90  BUN 5* 5* 5* 5* 6  CREATININE 0.45* 0.41* 0.41* 0.39* 0.43*  CALCIUM 8.1* 7.8* 7.9* 7.5* 7.9*   GFR: Estimated Creatinine Clearance: 89.1 mL/min (A) (by C-G formula based on SCr of 0.43 mg/dL (L)). Liver Function Tests: Recent Labs  Lab 03/30/20 1256  AST 113*  ALT 49*  ALKPHOS 142*  BILITOT 2.0*  PROT 6.5  ALBUMIN 2.6*   Recent Labs  Lab 03/30/20 1256  LIPASE 40   No results for input(s): AMMONIA in the last 168 hours. Coagulation Profile: Recent Labs  Lab 03/30/20 1934 03/31/20 0728  INR 1.1 1.1   Cardiac Enzymes: No results for input(s): CKTOTAL, CKMB, CKMBINDEX, TROPONINI in the last 168 hours. BNP (last 3 results) No results for input(s): PROBNP in the last 8760 hours. HbA1C: No results for input(s): HGBA1C in the last 72 hours. CBG: No results for input(s): GLUCAP in the last 168 hours. Lipid Profile: No results for input(s): CHOL, HDL, LDLCALC, TRIG, CHOLHDL, LDLDIRECT in the last 72 hours. Thyroid Function Tests: No results for input(s): TSH, T4TOTAL, FREET4, T3FREE, THYROIDAB in the last 72 hours. Anemia Panel: No results for input(s): VITAMINB12, FOLATE, FERRITIN, TIBC, IRON, RETICCTPCT in the last 72 hours. Sepsis Labs: Recent Labs  Lab 03/30/20 1256   LATICACIDVEN 2.1*    Recent Results (from the past 240 hour(s))  SARS Coronavirus 2 by RT PCR (hospital order, performed in [facility] hospital lab) Nasopharyngeal Nasopharyngeal Swab     Status: None   Collection Time: 03/30/20  1:37 PM   Specimen: Nasopharyngeal Swab  Result Value Ref Range Status   SARS Coronavirus 2 NEGATIVE NEGATIVE Final    Comment: (NOTE) SARS-CoV-2 target nucleic acids are NOT DETECTED.  The SARS-CoV-2 RNA is generally detectable in upper and lower respiratory specimens during the acute phase of infection. The lowest concentration of SARS-CoV-2 viral copies this assay can detect is 250 copies / mL. A negative result does not preclude SARS-CoV-2 infection and should not be used as the sole basis for treatment or other patient management decisions.  A negative result may  occur with improper specimen collection / handling, submission of specimen other than nasopharyngeal swab, presence of viral mutation(s) within the areas targeted by this assay, and inadequate number of viral copies (<250 copies / mL). A negative result must be combined with clinical observations, patient history, and epidemiological information.  Fact Sheet for Patients:   BoilerBrush.com.cy  Fact Sheet for Healthcare Providers: https://pope.com/  This test is not yet approved or  cleared by the [place] FDA and has been authorized for detection and/or diagnosis of SARS-CoV-2 by FDA under an Emergency Use Authorization (EUA).  This EUA will remain in effect (meaning this test can be used) for the duration of the COVID-19 declaration under Section 564(b)(1) of the Act, 21 U.S.C. section 360bbb-3(b)(1), unless the authorization is terminated or revoked sooner.  Performed at [facility], [address] [place], [place] [address]          Radiology Studies: US Abdomen Limited RUQ  Result Date: 03/31/2020 CLINICAL DATA:   59 year old male with elevated LFTs. EXAM: ULTRASOUND ABDOMEN LIMITED RIGHT UPPER QUADRANT COMPARISON:  None. FINDINGS: Gallbladder: The gallbladder is distended containing sludge and probable small mobile gallstones. Gallbladder wall thickening is identified. No sonographic Eulah Pont sign is present. Common bile duct: Diameter: 3.7 mm.  No biliary dilatation. Liver: Diffuse increase in echogenicity noted. The hepatic contour appears slightly nodular suspicious for cirrhosis. Increased heterogeneous hepatic echotexture noted without focal hepatic mass. Portal vein is patent on color Doppler imaging with normal direction of blood flow towards the liver. Other: Perihepatic ascites is noted. IMPRESSION: 1. Gallbladder sludge and probable small mobile gallstones with gallbladder wall thickening. This wall thickening may be related to hepatic dysfunction/ascites but consider nuclear medicine study if there is strong clinical suspicion for cystic duct obstruction/acute cholecystitis. 2. No evidence of biliary dilatation. 3. Question hepatic steatosis and cirrhosis. 4. Perihepatic ascites. Electronically Signed   By: Harmon Pier M.D.   On: 03/31/2020 10:56        Scheduled Meds: . chlordiazePOXIDE  5 mg Oral TID  . feeding supplement (ENSURE ENLIVE)  237 mL Oral BID BM  . folic acid  1 mg Intravenous Daily  . LORazepam  0-4 mg Intravenous Q6H   Or  . LORazepam  0-4 mg Oral Q6H  . [START ON 04/01/2020] LORazepam  0-4 mg Intravenous Q12H   Or  . [START ON 04/01/2020] LORazepam  0-4 mg Oral Q12H  . mouth rinse  15 mL Mouth Rinse BID  . nicotine  14 mg Transdermal Daily  . PARoxetine  20 mg Oral Daily  . potassium chloride  40 mEq Oral Once  . thiamine  100 mg Oral Daily   Or  . thiamine  100 mg Intravenous Daily    LOS: 1 day    Time spent: 35 minutes    Eivin Mascio Hoover Brunette, DO [facility]  If 7PM-7AM, please contact night-coverage [email or web address] 03/31/2020, 11:06 AM

## 2020-04-01 LAB — URINALYSIS, ROUTINE W REFLEX MICROSCOPIC
Bilirubin Urine: NEGATIVE
Glucose, UA: NEGATIVE mg/dL
Ketones, ur: NEGATIVE mg/dL
Leukocytes,Ua: NEGATIVE
Nitrite: NEGATIVE
Protein, ur: >=300 mg/dL — AB
Specific Gravity, Urine: 1.002 — ABNORMAL LOW (ref 1.005–1.030)
pH: 7 (ref 5.0–8.0)

## 2020-04-01 LAB — COMPREHENSIVE METABOLIC PANEL
ALT: 40 U/L (ref 0–44)
AST: 87 U/L — ABNORMAL HIGH (ref 15–41)
Albumin: 2.2 g/dL — ABNORMAL LOW (ref 3.5–5.0)
Alkaline Phosphatase: 102 U/L (ref 38–126)
Anion gap: 7 (ref 5–15)
BUN: <5 mg/dL — ABNORMAL LOW (ref 6–20)
CO2: 25 mmol/L (ref 22–32)
Calcium: 7.8 mg/dL — ABNORMAL LOW (ref 8.9–10.3)
Chloride: 89 mmol/L — ABNORMAL LOW (ref 98–111)
Creatinine, Ser: 0.37 mg/dL — ABNORMAL LOW (ref 0.61–1.24)
GFR calc Af Amer: >60 mL/min (ref >60)
GFR calc non Af Amer: >60 mL/min (ref >60)
Glucose, Bld: 97 mg/dL (ref 70–99)
Potassium: 3.3 mmol/L — ABNORMAL LOW (ref 3.5–5.1)
Sodium: 121 mmol/L — ABNORMAL LOW (ref 135–145)
Total Bilirubin: 1.3 mg/dL — ABNORMAL HIGH (ref 0.3–1.2)
Total Protein: 5.2 g/dL — ABNORMAL LOW (ref 6.5–8.1)

## 2020-04-01 LAB — PROTIME-INR
INR: 1.1 (ref 0.8–1.2)
Prothrombin Time: 14.1 seconds (ref 11.4–15.2)

## 2020-04-01 LAB — CBC
HCT: 25.5 % — ABNORMAL LOW (ref 39.0–52.0)
Hemoglobin: 9 g/dL — ABNORMAL LOW (ref 13.0–17.0)
MCH: 39.3 pg — ABNORMAL HIGH (ref 26.0–34.0)
MCHC: 35.3 g/dL (ref 30.0–36.0)
MCV: 111.4 fL — ABNORMAL HIGH (ref 80.0–100.0)
Platelets: 85 10*3/uL — ABNORMAL LOW (ref 150–400)
RBC: 2.29 MIL/uL — ABNORMAL LOW (ref 4.22–5.81)
RDW: 12 % (ref 11.5–15.5)
WBC: 3.5 10*3/uL — ABNORMAL LOW (ref 4.0–10.5)
nRBC: 0 % (ref 0.0–0.2)

## 2020-04-01 LAB — LACTIC ACID, PLASMA: Lactic Acid, Venous: 1 mmol/L (ref 0.5–1.9)

## 2020-04-01 MED ORDER — SODIUM CHLORIDE 0.9 % IV SOLN: INTRAVENOUS | Status: AC

## 2020-04-01 MED ORDER — FOLIC ACID 1 MG PO TABS
1.0000 mg | ORAL_TABLET | Freq: Every day | ORAL | Status: DC
  Administered 2020-04-01 – 2020-04-11 (×11): 1 mg via ORAL
  Filled 2020-04-01 (×11): qty 1

## 2020-04-01 MED ORDER — POTASSIUM CHLORIDE CRYS ER 20 MEQ PO TBCR
40.0000 meq | EXTENDED_RELEASE_TABLET | Freq: Once | ORAL | Status: AC
  Administered 2020-04-01: 40 meq via ORAL
  Filled 2020-04-01: qty 2

## 2020-04-01 NOTE — Progress Notes (Signed)
PROGRESS NOTE    Clifford Hunt  VVO:[id number] DOB: 24-Oct-1960 DOA: 03/30/2020 PCP: [facility]   Brief Narrative:  Per HPI: Clifford Hunt a 59 y.o.malewith a history of alcoholismwith 5-6 beers a day with little to no oral food intake. Patient was seen at [facility] and had blood work, which showed hyponatremia. The patient was called by his PCPs office and instructed to come to the emergency room for evaluation. He reports feeling weak with little to no energy. No palliating or provoking factors. His last alcohol consumption was earlier today: He had 2 beers prior to getting up. He does report some nausea and lack of appetite. He does have a longstanding history of foods not tasting right, which affects his appetite. His symptoms are unchanged. He does bruise easily, especially in his arms and hands. He has no swelling in his legs.  9/25: Patient was admitted with acute hyponatremia with serum sodium of 114 in the setting of beer Poto mania.  This was noted on blood work as noted above.  He was noted to have some nausea and change in appetite.  His sodium levels have improved to 122 this morning and further IV fluid will be held and he will be maintained on fluid restriction for now with repeat labs in a.m.  2D echocardiogram ordered and pending.  Potassium will be supplemented.  9/26: Patient continues to have some weakness but is more awake and alert today.  His serum sodium remained stable at 121.  Plan to restart IV normal saline.  2D echocardiogram pending.  Right upper quadrant ultrasound with findings of liver cirrhosis and gallbladder sludge and stones noted.  He appears to be tolerating diet.  He will require PT evaluation and likely need placement to SNF/rehab.  Assessment & Plan:   Principal Problem:   Acute hyponatremia Active Problems:   Elevated LFTs   Alcohol abuse   Anemia, iron deficiency   Hyponatremia  acute versus chronic-stable -Likely related to beer potomania -Continue fluid restriction and resume IV normal saline as sodium is still 121 -Recheck labs in a.m.  History of alcohol abuse -Continue alcohol withdrawal protocol with Librium ordered twice daily -Patient has been reluctant to pursue alcohol counseling in the past.  Transaminitis-improving -Likely secondary to alcohol abuse with noted cirrhosis on ultrasound 9/25 -Recheck labs in a.m.  Anemia/thrombocytopenia-stable -Likely secondary to poor nutrition and ongoing chronic alcohol abuse -Anemia panel ordered with no acute findings -Recheck labs in a.m. to follow trend  Ongoing tobacco abuse -Nicotine patch -Counseled on cessation  Ambulatory dysfunction -Likely related to chronic alcohol use -Fall precautions -PT evaluation -Likely will require SNF  DVT prophylaxis:SCDs Code Status: Full Family Communication: Discussed with fianc on phone 9/26 Disposition Plan:  Status is: Inpatient  Remains inpatient appropriate because:IV treatments appropriate due to intensity of illness or inability to take PO and Inpatient level of care appropriate due to severity of illness   Dispo: The patient is from: Home  Anticipated d/c is to: SNF  Anticipated d/c date is: 1-2 days  Patient currently is not medically stable to d/c.  PT evaluation pending and patient will likely require SNF on discharge.  Continue to work on serum sodium levels.  Consultants:   None  Procedures:   See below  Antimicrobials:   None  Subjective: Patient seen and evaluated today with no new acute complaints or concerns. No acute concerns or events noted overnight.  Objective: Vitals:   03/31/20 1705  03/31/20 1815 03/31/20 2142 04/01/20 0345  BP:   (!) 140/97 120/81  Pulse: (!) 112 (!) 102 98 92  Resp: 16  17 16   Temp:   98.1 F (36.7 C)   TempSrc:   Oral   SpO2:   95% 96%  Weight:       Height:        Intake/Output Summary (Last 24 hours) at 04/01/2020 1011 Last data filed at 04/01/2020 0900 Gross per 24 hour  Intake 720 ml  Output --  Net 720 ml   Filed Weights   03/30/20 1228  Weight: 62.6 kg    Examination:  General exam: Appears calm and comfortable  Respiratory system: Clear to auscultation. Respiratory effort normal. Currently on RA. Cardiovascular system: S1 & S2 heard, RRR.  Gastrointestinal system: Abdomen is nondistended, soft and nontender.   Central nervous system: Alert and awake Extremities: No edema Skin: No rashes, lesions or ulcers Psychiatry: Flat affect.    Data Reviewed: I have personally reviewed following labs and imaging studies  CBC: Recent Labs  Lab 03/30/20 1256 03/31/20 0728 04/01/20 0731  WBC 7.4 4.1 3.5*  NEUTROABS 5.8  --   --   HGB 10.7* 8.9* 9.0*  HCT 29.6* 25.3* 25.5*  MCV 108.4* 110.5* 111.4*  PLT 121* 86* 85*   Basic Metabolic Panel: Recent Labs  Lab 03/30/20 1536 03/30/20 1934 03/31/20 0130 03/31/20 0728 04/01/20 0731  NA 119* 121* 120* 122* 121*  K 3.5 3.5 3.4* 3.3* 3.3*  CL 86* 89* 89* 88* 89*  CO2 22 22 22 25 25   GLUCOSE 93 104* 98 90 97  BUN 5* 5* 5* 6 <5*  CREATININE 0.41* 0.41* 0.39* 0.43* 0.37*  CALCIUM 7.8* 7.9* 7.5* 7.9* 7.8*   GFR: Estimated Creatinine Clearance: 89.1 mL/min (A) (by C-G formula based on SCr of 0.37 mg/dL (L)). Liver Function Tests: Recent Labs  Lab 03/30/20 1256 03/31/20 1155 04/01/20 0731  AST 113* 111* 87*  ALT 49* 44 40  ALKPHOS 142* 118 102  BILITOT 2.0* 1.9* 1.3*  PROT 6.5 5.4* 5.2*  ALBUMIN 2.6* 2.2* 2.2*   Recent Labs  Lab 03/30/20 1256  LIPASE 40   No results for input(s): AMMONIA in the last 168 hours. Coagulation Profile: Recent Labs  Lab 03/30/20 1934 03/31/20 0728 04/01/20 0731  INR 1.1 1.1 1.1   Cardiac Enzymes: No results for input(s): CKTOTAL, CKMB, CKMBINDEX, TROPONINI in the last 168 hours. BNP (last 3 results) No results for  input(s): PROBNP in the last 8760 hours. HbA1C: No results for input(s): HGBA1C in the last 72 hours. CBG: No results for input(s): GLUCAP in the last 168 hours. Lipid Profile: No results for input(s): CHOL, HDL, LDLCALC, TRIG, CHOLHDL, LDLDIRECT in the last 72 hours. Thyroid Function Tests: No results for input(s): TSH, T4TOTAL, FREET4, T3FREE, THYROIDAB in the last 72 hours. Anemia Panel: Recent Labs    03/31/20 1155  VITAMINB12 840  FOLATE 7.3  FERRITIN 1,418*  TIBC 163*  IRON 86  RETICCTPCT 4.3*   Sepsis Labs: Recent Labs  Lab 03/30/20 1256 04/01/20 0731  LATICACIDVEN 2.1* 1.0    Recent Results (from the past 240 hour(s))  SARS Coronavirus 2 by RT PCR (hospital order, performed in [facility] hospital lab) Nasopharyngeal Nasopharyngeal Swab     Status: None   Collection Time: 03/30/20  1:37 PM   Specimen: Nasopharyngeal Swab  Result Value Ref Range Status   SARS Coronavirus 2 NEGATIVE NEGATIVE Final    Comment: (NOTE) SARS-CoV-2 target  nucleic acids are NOT DETECTED.  The SARS-CoV-2 RNA is generally detectable in upper and lower respiratory specimens during the acute phase of infection. The lowest concentration of SARS-CoV-2 viral copies this assay can detect is 250 copies / mL. A negative result does not preclude SARS-CoV-2 infection and should not be used as the sole basis for treatment or other patient management decisions.  A negative result may occur with improper specimen collection / handling, submission of specimen other than nasopharyngeal swab, presence of viral mutation(s) within the areas targeted by this assay, and inadequate number of viral copies (<250 copies / mL). A negative result must be combined with clinical observations, patient history, and epidemiological information.  Fact Sheet for Patients:   BoilerBrush.com.cy  Fact Sheet for Healthcare Providers: https://pope.com/  This test is not  yet approved or  cleared by the [place] FDA and has been authorized for detection and/or diagnosis of SARS-CoV-2 by FDA under an Emergency Use Authorization (EUA).  This EUA will remain in effect (meaning this test can be used) for the duration of the COVID-19 declaration under Section 564(b)(1) of the Act, 21 U.S.C. section 360bbb-3(b)(1), unless the authorization is terminated or revoked sooner.  Performed at [facility], [address], [place], [place] [address]          Radiology Studies: US Abdomen Limited RUQ  Result Date: 03/31/2020 CLINICAL DATA:  59 year old male with elevated LFTs. EXAM: ULTRASOUND ABDOMEN LIMITED RIGHT UPPER QUADRANT COMPARISON:  None. FINDINGS: Gallbladder: The gallbladder is distended containing sludge and probable small mobile gallstones. Gallbladder wall thickening is identified. No sonographic Eulah Pont sign is present. Common bile duct: Diameter: 3.7 mm.  No biliary dilatation. Liver: Diffuse increase in echogenicity noted. The hepatic contour appears slightly nodular suspicious for cirrhosis. Increased heterogeneous hepatic echotexture noted without focal hepatic mass. Portal vein is patent on color Doppler imaging with normal direction of blood flow towards the liver. Other: Perihepatic ascites is noted. IMPRESSION: 1. Gallbladder sludge and probable small mobile gallstones with gallbladder wall thickening. This wall thickening may be related to hepatic dysfunction/ascites but consider nuclear medicine study if there is strong clinical suspicion for cystic duct obstruction/acute cholecystitis. 2. No evidence of biliary dilatation. 3. Question hepatic steatosis and cirrhosis. 4. Perihepatic ascites. Electronically Signed   By: Harmon Pier M.D.   On: 03/31/2020 10:56        Scheduled Meds: . vitamin C  500 mg Oral BID  . chlordiazePOXIDE  5 mg Oral TID  . feeding supplement (ENSURE ENLIVE)  237 mL Oral BID BM  . folic acid  1 mg Oral Daily  .  LORazepam  0-4 mg Intravenous Q6H   Or  . LORazepam  0-4 mg Oral Q6H  . LORazepam  0-4 mg Intravenous Q12H   Or  . LORazepam  0-4 mg Oral Q12H  . mouth rinse  15 mL Mouth Rinse BID  . multivitamin with minerals  1 tablet Oral Daily  . nicotine  14 mg Transdermal Daily  . PARoxetine  20 mg Oral Daily  . potassium chloride  40 mEq Oral Once  . thiamine  100 mg Oral Daily   Or  . thiamine  100 mg Intravenous Daily   Continuous Infusions: . sodium chloride       LOS: 2 days    Time spent: 35 minutes    Jermon Chalfant Hoover Brunette, DO [facility]  If 7PM-7AM, please contact night-coverage [email or web address] 04/01/2020, 10:11 AM

## 2020-04-02 LAB — COMPREHENSIVE METABOLIC PANEL
ALT: 45 U/L — ABNORMAL HIGH (ref 0–44)
AST: 98 U/L — ABNORMAL HIGH (ref 15–41)
Albumin: 2.1 g/dL — ABNORMAL LOW (ref 3.5–5.0)
Alkaline Phosphatase: 109 U/L (ref 38–126)
Anion gap: 8 (ref 5–15)
BUN: <5 mg/dL — ABNORMAL LOW (ref 6–20)
CO2: 24 mmol/L (ref 22–32)
Calcium: 7.7 mg/dL — ABNORMAL LOW (ref 8.9–10.3)
Chloride: 91 mmol/L — ABNORMAL LOW (ref 98–111)
Creatinine, Ser: 0.38 mg/dL — ABNORMAL LOW (ref 0.61–1.24)
GFR calc Af Amer: >60 mL/min (ref >60)
GFR calc non Af Amer: >60 mL/min (ref >60)
Glucose, Bld: 98 mg/dL (ref 70–99)
Potassium: 3.7 mmol/L (ref 3.5–5.1)
Sodium: 123 mmol/L — ABNORMAL LOW (ref 135–145)
Total Bilirubin: 1.1 mg/dL (ref 0.3–1.2)
Total Protein: 5.2 g/dL — ABNORMAL LOW (ref 6.5–8.1)

## 2020-04-02 LAB — CBC
HCT: 26.9 % — ABNORMAL LOW (ref 39.0–52.0)
Hemoglobin: 9.3 g/dL — ABNORMAL LOW (ref 13.0–17.0)
MCH: 38.9 pg — ABNORMAL HIGH (ref 26.0–34.0)
MCHC: 34.6 g/dL (ref 30.0–36.0)
MCV: 112.6 fL — ABNORMAL HIGH (ref 80.0–100.0)
Platelets: 89 10*3/uL — ABNORMAL LOW (ref 150–400)
RBC: 2.39 MIL/uL — ABNORMAL LOW (ref 4.22–5.81)
RDW: 11.9 % (ref 11.5–15.5)
WBC: 3.9 10*3/uL — ABNORMAL LOW (ref 4.0–10.5)
nRBC: 0 % (ref 0.0–0.2)

## 2020-04-02 LAB — MAGNESIUM: Magnesium: 1.4 mg/dL — ABNORMAL LOW (ref 1.7–2.4)

## 2020-04-02 MED ORDER — MAGNESIUM SULFATE 2 GM/50ML IV SOLN
2.0000 g | Freq: Once | INTRAVENOUS | Status: AC
  Administered 2020-04-02: 2 g via INTRAVENOUS
  Filled 2020-04-02: qty 50

## 2020-04-02 MED ORDER — SODIUM CHLORIDE 0.9 % IV SOLN: INTRAVENOUS | Status: DC

## 2020-04-02 NOTE — Plan of Care (Signed)
  Problem: Acute Rehab PT Goals(only PT should resolve) Goal: Pt will Roll Supine to Side Outcome: Progressing Flowsheets (Taken 04/02/2020 1416) Pt will Roll Supine to Side: with min assist Goal: Pt Will Go Supine/Side To Sit Outcome: Progressing Flowsheets (Taken 04/02/2020 1416) Pt will go Supine/Side to Sit: with minimal assist Goal: Pt Will Go Sit To Supine/Side Outcome: Progressing Flowsheets (Taken 04/02/2020 1417) Pt will go Sit to Supine/Side: with minimal assist Goal: Patient Will Perform Sitting Balance Outcome: Progressing Flowsheets (Taken 04/02/2020 1417) Patient will perform sitting balance: with min guard assist Goal: Patient Will Transfer Sit To/From Stand Outcome: Progressing Flowsheets (Taken 04/02/2020 1417) Patient will transfer sit to/from stand: with minimal assist Goal: Pt Will Transfer Bed To Chair/Chair To Bed Outcome: Progressing Flowsheets (Taken 04/02/2020 1417) Pt will Transfer Bed to Chair/Chair to Bed: with min assist    [name], MS, PT [facility] [phone] 04/02/2020

## 2020-04-02 NOTE — [place] FL2 (Signed)
[place] MEDICAID FL2 LEVEL OF CARE SCREENING TOOL     IDENTIFICATION  Patient Name: Clifford Hunt Birthdate: 07/09/1960 Sex: male Admission Date (Current Location): 03/30/2020  [facility] and [place] Number:  [employer] and Address:  [facility],  [address], [place] [address]      Provider Number: [id number]  Attending Physician Name and Address:  Erick Blinks, DO  Relative Name and Phone Number:  [name] - sister  [phone]( work number [phone])    Current Level of Care: Hospital Recommended Level of Care: Skilled Nursing Facility Prior Approval Number:    Date Approved/Denied:   PASRR Number:    Discharge Plan: SNF    Current Diagnoses: Patient Active Problem List   Diagnosis Date Noted  . Acute hyponatremia 03/30/2020  . Elevated LFTs 03/30/2020  . Alcohol abuse 03/30/2020  . Anemia, iron deficiency 03/30/2020    Orientation RESPIRATION BLADDER Height & Weight     Self, Time, Situation, Place  Normal External catheter, Continent Weight: 62.6 kg Height:  5\' 10"  (177.8 cm)  BEHAVIORAL SYMPTOMS/MOOD NEUROLOGICAL BOWEL NUTRITION STATUS      Continent Diet (See DC summary)  AMBULATORY STATUS COMMUNICATION OF NEEDS Skin   Extensive Assist Verbally Skin abrasions (Bilateral arms)                       Personal Care Assistance Level of Assistance  Bathing, Dressing, Feeding Bathing Assistance: Maximum assistance Feeding assistance: Limited assistance Dressing Assistance: Limited assistance     Functional Limitations Info  Sight, Hearing, Speech Sight Info: Adequate Hearing Info: Adequate Speech Info: Adequate    SPECIAL CARE FACTORS FREQUENCY  PT (By licensed PT)                    Contractures Contractures Info: Not present    Additional Factors Info  Code Status, Allergies, Psychotropic Code Status Info: FULL Allergies Info: Morphine Psychotropic Info: Librium         Current Medications  (04/02/2020):  This is the current hospital active medication list Current Facility-Administered Medications  Medication Dose Route Frequency Provider Last Rate Last Admin  . 0.9 %  sodium chloride infusion   Intravenous Continuous 04/04/2020, Pratik D, DO      . alum & mag hydroxide-simeth (MAALOX/MYLANTA) 200-200-20 MG/5ML suspension 30 mL  30 mL Oral Q4H PRN 01-13-2001, Pratik D, DO   30 mL at 03/31/20 2222  . ascorbic acid (VITAMIN C) tablet 500 mg  500 mg Oral BID 2223, Pratik D, DO   500 mg at 04/02/20 1004  . chlordiazePOXIDE (LIBRIUM) capsule 5 mg  5 mg Oral TID 04/04/20, Pratik D, DO   5 mg at 04/02/20 1004  . feeding supplement (ENSURE ENLIVE) (ENSURE ENLIVE) liquid 237 mL  237 mL Oral BID BM 04/04/20, DO   237 mL at 04/02/20 1004  . folic acid (FOLVITE) tablet 1 mg  1 mg Oral Daily 04/04/20, Pratik D, DO   1 mg at 04/02/20 1012  . LORazepam (ATIVAN) injection 0-4 mg  0-4 mg Intravenous Q12H 04/04/20, DO   2 mg at 04/02/20 1013   Or  . LORazepam (ATIVAN) tablet 0-4 mg  0-4 mg Oral Q12H 04/04/20, DO      . magnesium sulfate IVPB 2 g 50 mL  2 g Intravenous Once Levie Heritage, Pratik D, DO      . MEDLINE mouth rinse  15 mL Mouth Rinse  BID Levie Heritage, DO   15 mL at 04/02/20 1005  . metoprolol tartrate (LOPRESSOR) injection 10 mg  10 mg Intravenous Q6H PRN Sherryll Burger, Pratik D, DO      . multivitamin with minerals tablet 1 tablet  1 tablet Oral Daily Sherryll Burger, Pratik D, DO   1 tablet at 04/02/20 1004  . nicotine (NICODERM CQ - dosed in mg/24 hours) patch 14 mg  14 mg Transdermal Daily Levie Heritage, DO   14 mg at 04/02/20 1005  . ondansetron (ZOFRAN) tablet 4 mg  4 mg Oral Q6H PRN Levie Heritage, DO       Or  . ondansetron [facility]) injection 4 mg  4 mg Intravenous Q6H PRN Levie Heritage, DO      . PARoxetine (PAXIL) tablet 20 mg  20 mg Oral Daily Levie Heritage, DO   20 mg at 04/02/20 1004  . thiamine tablet 100 mg  100 mg Oral Daily Levie Heritage, DO   100 mg at 04/02/20 1004   Or  .  thiamine (B-1) injection 100 mg  100 mg Intravenous Daily Levie Heritage, DO   100 mg at 03/30/20 1341     Discharge Medications: Please see discharge summary for a list of discharge medications.  Relevant Imaging Results:  Relevant Lab Results:   Additional Information SS# [id number]  Leitha Bleak, RN

## 2020-04-02 NOTE — TOC Initial Note (Signed)
Transition of Care [facility]) - Initial/Assessment Note    Patient Details  Name: Clifford Hunt MRN: [id number] Date of Birth: 1960-10-03  Transition of [facility]) CM/SW Contact:    Leitha Bleak, RN Phone Number: 04/02/2020, 12:14 PM  Clinical Narrative:     Patient admitted with Acute hyponatremia. Patient abuses Alcohol. Clifford Hunt, patient sister called, her work number is [phone] from 8:00-  4:30. Patient has a girlfriend that stays with him. Clifford Hunt and siblings has discussed SNF with patient due to weakness. They are requesting [facility] in [place]. TOC sent referral and called admissions, they will accept patient when medically ready.  TOC will send PT notes when available, Facility will start insurance Auth. TOC to follow.          Expected Discharge Plan: Skilled Nursing Facility Barriers to Discharge: Continued Medical Work up, SNF Pending bed offer   Patient Goals and CMS Choice Patient states their goals for this hospitalization and ongoing recovery are:: to go to SNF. CMS Medicare.gov Compare Post Acute Care list provided to:: Patient Represenative (must comment) Choice offered to / list presented to : Sibling  Expected Discharge Plan and Services Expected Discharge Plan: Skilled Nursing Facility       Living arrangements for the past 2 months: Single Family Home                     Prior Living Arrangements/Services Living arrangements for the past 2 months: Single Family Home Lives with:: Significant Other Patient language and need for interpreter reviewed:: Yes        Need for Family Participation in Patient Care: Yes (Comment) Care giver support system in place?: Yes (comment)   Criminal Activity/Legal Involvement Pertinent to Current Situation/Hospitalization: No - Comment as needed  Activities of Daily Living Home Assistive Devices/Equipment: Wheelchair, Environmental consultant (specify type), Cane (specify quad or straight) ADL Screening (condition at time of  admission) Patient's cognitive ability adequate to safely complete daily activities?: Yes Is the patient deaf or have difficulty hearing?: No Does the patient have difficulty seeing, even when wearing glasses/contacts?: No Does the patient have difficulty concentrating, remembering, or making decisions?: No Patient able to express need for assistance with ADLs?: Yes Does the patient have difficulty dressing or bathing?: No Independently performs ADLs?: Yes (appropriate for developmental age) Does the patient have difficulty walking or climbing stairs?: Yes Weakness of Legs: Both Weakness of Arms/Hands: None  Permission Sought/Granted      Share Information with NAME: Clifford Hunt     Permission granted to share info w Relationship: Sister     Emotional Assessment       Orientation: : Oriented to  Time, Oriented to Self, Oriented to Place, Oriented to Situation Alcohol / Substance Use: Alcohol Use Psych Involvement: No (comment)  Admission diagnosis:  Acute hyponatremia [E87.1] Alcohol abuse [F10.10] Hyponatremia [E87.1] Thrombocytopenia (HCC) [D69.6] Elevated LFTs [R79.89] Anemia, unspecified type [D64.9] Patient Active Problem List   Diagnosis Date Noted  . Acute hyponatremia 03/30/2020  . Elevated LFTs 03/30/2020  . Alcohol abuse 03/30/2020  . Anemia, iron deficiency 03/30/2020   PCP:  [facility] Pharmacy:   [facility] [place], [place] - [address] AT Portal to Registered [employer] Sites [address] [place] [address] Phone: [phone] Fax: [phone]  [id number] [place], [place] - [address] [place] [address] Phone: [phone] Fax: [phone]

## 2020-04-02 NOTE — Progress Notes (Signed)
PROGRESS NOTE    Clifford Hunt  EQA:[id number] DOB: 28-Feb-1961 DOA: 03/30/2020 PCP: [facility]   Brief Narrative:  Per HPI: Clifford Hunt a 59 y.o.malewith a history of alcoholismwith 5-6 beers a day with little to no oral food intake. Patient was seen at [facility] and had blood work, which showed hyponatremia. The patient was called by his PCPs office and instructed to come to the emergency room for evaluation. He reports feeling weak with little to no energy. No palliating or provoking factors. His last alcohol consumption was earlier today: He had 2 beers prior to getting up. He does report some nausea and lack of appetite. He does have a longstanding history of foods not tasting right, which affects his appetite. His symptoms are unchanged. He does bruise easily, especially in his arms and hands. He has no swelling in his legs.  9/25:Patient was admitted with acute hyponatremia with serum sodium of 114 in the setting of beer [place] mania. This was noted on blood work as noted above. He was noted to have some nausea and change in appetite. His sodium levels have improved to 122 this morning and further IV fluid will be held and he will be maintained on fluid restriction for now with repeat labs in a.m. 2D echocardiogram ordered and pending. Potassium will be supplemented.  9/26: Patient continues to have some weakness but is more awake and alert today.  His serum sodium remained stable at 121.  Plan to restart IV normal saline.  2D echocardiogram pending.  Right upper quadrant ultrasound with findings of liver cirrhosis and gallbladder sludge and stones noted.  He appears to be tolerating diet.  He will require PT evaluation and likely need placement to SNF/rehab.  9/27: Patient continues to remain weak with sodium levels of 123 noted today.  Plan to continue IV normal saline throughout the day at higher rate and recheck in  a.m.  Replete magnesium.  PT evaluation pending.  Assessment & Plan:   Principal Problem:   Acute hyponatremia Active Problems:   Elevated LFTs   Alcohol abuse   Anemia, iron deficiency   Hyponatremia acute versus chronic-stable and slowly improving -Likely related tobeer potomania -Continue more aggressive dosing of IV normal saline at 100 mL/h -Recheck labs in a.m.  History of alcohol abuse -Continue alcohol withdrawal protocol with Librium ordered twice daily -Patient has been reluctant to pursue alcohol counseling in the past.  Transaminitis-improving -Likely secondary to alcohol abuse with noted cirrhosis on ultrasound 9/25 -Recheck labs in a.m.  Hypomagnesemia -Replete and reevaluate in a.m.  Anemia/thrombocytopenia-stable -Likely secondary to poor nutrition and ongoing chronic alcohol abuse -Anemia panel ordered with no acute findings -Recheck labs in a.m. to follow trend  Ongoing tobacco abuse -Nicotine patch -Counseled on cessation  Ambulatory dysfunction -Likely related to chronic alcohol use -Fall precautions -PT evaluation -Likely will require SNF  DVT prophylaxis:SCDs Code Status:Full Family Communication:Discussed with fianc on phone 9/26 Disposition Plan: Status is: Inpatient  Remains inpatient appropriate because:IV treatments appropriate due to intensity of illness or inability to take PO and Inpatient level of care appropriate due to severity of illness   Dispo: The patient is from:Home Anticipated d/c is to:SNF Anticipated d/c date is: 1-2 days Patient currently is not medically stable to d/c.  PT evaluation pending and patient will likely require SNF on discharge.  Continue to work on serum sodium levels.  Consultants:  None  Procedures:  See below  Antimicrobials:  None  Subjective: Patient seen and evaluated today with no new acute complaints or concerns. No acute  concerns or events noted overnight.  Objective: Vitals:   04/01/20 1424 04/01/20 2124 04/02/20 0500 04/02/20 0529  BP: 113/82 103/80 100/78   Pulse: (!) 105 79 (!) 110 91  Resp: 17 16 20    Temp: 98.6 F (37 C) 98.3 F (36.8 C) 98.5 F (36.9 C)   TempSrc: Oral Oral Oral   SpO2: 100% 100% 100%   Weight:      Height:        Intake/Output Summary (Last 24 hours) at 04/02/2020 1108 Last data filed at 04/01/2020 1700 Gross per 24 hour  Intake 720 ml  Output 650 ml  Net 70 ml   Filed Weights   03/30/20 1228  Weight: 62.6 kg    Examination:  General exam: Appears calm and comfortable  Respiratory system: Clear to auscultation. Respiratory effort normal. Cardiovascular system: S1 & S2 heard, RRR.  Gastrointestinal system: Abdomen is nondistended, soft and nontender.  Central nervous system: Alert and awake Extremities: No edema Skin: No rashes, lesions or ulcers Psychiatry: Flat affect    Data Reviewed: I have personally reviewed following labs and imaging studies  CBC: Recent Labs  Lab 03/30/20 1256 03/31/20 0728 04/01/20 0731 04/02/20 0601  WBC 7.4 4.1 3.5* 3.9*  NEUTROABS 5.8  --   --   --   HGB 10.7* 8.9* 9.0* 9.3*  HCT 29.6* 25.3* 25.5* 26.9*  MCV 108.4* 110.5* 111.4* 112.6*  PLT 121* 86* 85* 89*   Basic Metabolic Panel: Recent Labs  Lab 03/30/20 1934 03/31/20 0130 03/31/20 0728 04/01/20 0731 04/02/20 0601  NA 121* 120* 122* 121* 123*  K 3.5 3.4* 3.3* 3.3* 3.7  CL 89* 89* 88* 89* 91*  CO2 22 22 25 25 24   GLUCOSE 104* 98 90 97 98  BUN 5* 5* 6 <5* <5*  CREATININE 0.41* 0.39* 0.43* 0.37* 0.38*  CALCIUM 7.9* 7.5* 7.9* 7.8* 7.7*  MG  --   --   --   --  1.4*   GFR: Estimated Creatinine Clearance: 89.1 mL/min (A) (by C-G formula based on SCr of 0.38 mg/dL (L)). Liver Function Tests: Recent Labs  Lab 03/30/20 1256 03/31/20 1155 04/01/20 0731 04/02/20 0601  AST 113* 111* 87* 98*  ALT 49* 44 40 45*  ALKPHOS 142* 118 102 109  BILITOT 2.0* 1.9*  1.3* 1.1  PROT 6.5 5.4* 5.2* 5.2*  ALBUMIN 2.6* 2.2* 2.2* 2.1*   Recent Labs  Lab 03/30/20 1256  LIPASE 40   No results for input(s): AMMONIA in the last 168 hours. Coagulation Profile: Recent Labs  Lab 03/30/20 1934 03/31/20 0728 04/01/20 0731  INR 1.1 1.1 1.1   Cardiac Enzymes: No results for input(s): CKTOTAL, CKMB, CKMBINDEX, TROPONINI in the last 168 hours. BNP (last 3 results) No results for input(s): PROBNP in the last 8760 hours. HbA1C: No results for input(s): HGBA1C in the last 72 hours. CBG: No results for input(s): GLUCAP in the last 168 hours. Lipid Profile: No results for input(s): CHOL, HDL, LDLCALC, TRIG, CHOLHDL, LDLDIRECT in the last 72 hours. Thyroid Function Tests: No results for input(s): TSH, T4TOTAL, FREET4, T3FREE, THYROIDAB in the last 72 hours. Anemia Panel: Recent Labs    03/31/20 1155  VITAMINB12 840  FOLATE 7.3  FERRITIN 1,418*  TIBC 163*  IRON 86  RETICCTPCT 4.3*   Sepsis Labs: Recent Labs  Lab 03/30/20 1256 04/01/20 0731  LATICACIDVEN 2.1* 1.0    Recent  Results (from the past 240 hour(s))  SARS Coronavirus 2 by RT PCR (hospital order, performed in [facility] hospital lab) Nasopharyngeal Nasopharyngeal Swab     Status: None   Collection Time: 03/30/20  1:37 PM   Specimen: Nasopharyngeal Swab  Result Value Ref Range Status   SARS Coronavirus 2 NEGATIVE NEGATIVE Final    Comment: (NOTE) SARS-CoV-2 target nucleic acids are NOT DETECTED.  The SARS-CoV-2 RNA is generally detectable in upper and lower respiratory specimens during the acute phase of infection. The lowest concentration of SARS-CoV-2 viral copies this assay can detect is 250 copies / mL. A negative result does not preclude SARS-CoV-2 infection and should not be used as the sole basis for treatment or other patient management decisions.  A negative result may occur with improper specimen collection / handling, submission of specimen other than nasopharyngeal swab,  presence of viral mutation(s) within the areas targeted by this assay, and inadequate number of viral copies (<250 copies / mL). A negative result must be combined with clinical observations, patient history, and epidemiological information.  Fact Sheet for Patients:   BoilerBrush.com.cy  Fact Sheet for Healthcare Providers: https://pope.com/  This test is not yet approved or  cleared by the [place] FDA and has been authorized for detection and/or diagnosis of SARS-CoV-2 by FDA under an Emergency Use Authorization (EUA).  This EUA will remain in effect (meaning this test can be used) for the duration of the COVID-19 declaration under Section 564(b)(1) of the Act, 21 U.S.C. section 360bbb-3(b)(1), unless the authorization is terminated or revoked sooner.  Performed at [facility], [address], [place], [place] [address]          Radiology Studies: No results found.      Scheduled Meds: . vitamin C  500 mg Oral BID  . chlordiazePOXIDE  5 mg Oral TID  . feeding supplement (ENSURE ENLIVE)  237 mL Oral BID BM  . folic acid  1 mg Oral Daily  . LORazepam  0-4 mg Intravenous Q12H   Or  . LORazepam  0-4 mg Oral Q12H  . mouth rinse  15 mL Mouth Rinse BID  . multivitamin with minerals  1 tablet Oral Daily  . nicotine  14 mg Transdermal Daily  . PARoxetine  20 mg Oral Daily  . thiamine  100 mg Oral Daily   Or  . thiamine  100 mg Intravenous Daily    LOS: 3 days    Time spent: 30 minutes    Norvin Ohlin Hoover Brunette, DO [facility]  If 7PM-7AM, please contact night-coverage [email or web address] 04/02/2020, 11:08 AM

## 2020-04-02 NOTE — Evaluation (Signed)
Physical Therapy Evaluation Patient Details Name: Clifford Hunt MRN: [id number] DOB: 1960-08-12 Today's Date: 04/02/2020   History of Present Illness  Clifford Hunt is a 59 y.o. male with a history of alcoholism with 5-6 beers a day with little to no oral food intake.  Patient was seen at [facility] and had blood work, which showed hyponatremia.  The patient was called by his PCPs office and instructed to come to the emergency room for evaluation.  He reports feeling weak with little to no energy.  No palliating or provoking factors.  His last alcohol consumption was earlier today: He had 2 beers prior to getting up.  He does report some nausea and lack of appetite.  He does have a longstanding history of foods not tasting right, which affects his appetite.  His symptoms are unchanged.  He does bruise easily, especially in his arms and hands.  He has no swelling in his legs.    Clinical Impression  Pt admitted with above diagnosis. Patient requires min to mod assistance for all mobility. Limited to side-stepping with RW along bedside for ambulation due to weakness; required mod assist due to posterior lean; knees remained in flexed position during all weight bearing activities; stooped posturing. Pt currently with functional limitations due to the deficits listed below (see PT Problem List). Pt will benefit from skilled PT to increase their independence and safety with mobility to allow discharge to the venue listed below.       Follow Up Recommendations SNF;Supervision/Assistance - 24 hour    Equipment Recommendations  None recommended by PT    Recommendations for Other Services       Precautions / Restrictions Precautions Precautions: Fall Restrictions Weight Bearing Restrictions: No      Mobility  Bed Mobility Overal bed mobility: Needs Assistance Bed Mobility: Supine to Sit;Sit to Supine     Supine to sit: Mod assist Sit to supine: Mod assist   General  bed mobility comments: for trunk and LEs  Transfers Overall transfer level: Needs assistance Equipment used: Rolling walker (2 wheeled) Transfers: Sit to/from [employer] Sit to Stand: Min assist Stand pivot transfers: Mod assist       General transfer comment: posterior lean; unable to maintain upright posture; knees remain flexed  Ambulation/Gait Ambulation/Gait assistance: Mod assist Gait Distance (Feet): 4 Feet Assistive device: Rolling walker (2 wheeled) Gait Pattern/deviations: Step-to pattern;Decreased step length - right;Decreased step length - left;Decreased stride length;Trunk flexed;Leaning posteriorly Gait velocity: decreased   General Gait Details: posterior lean; unable to maintain upright posture; knees remain flexed; slow, labored gait w/ RW; limited by fatigue   Modified Rankin (Stroke Patients Only)       Balance Overall balance assessment: Needs assistance Sitting-balance support: Bilateral upper extremity supported;Feet supported Sitting balance-Leahy Scale: Poor   Postural control: Posterior lean Standing balance support: Bilateral upper extremity supported;During functional activity Standing balance-Leahy Scale: Poor Standing balance comment: poor w/ RW         Pertinent Vitals/Pain Pain Assessment: No/denies pain    Home Living Family/patient expects to be discharged to:: Skilled nursing facility Living Arrangements: Spouse/significant other Available Help at Discharge: Available PRN/intermittently (girlfriend) Type of Home: House Home Access: Level entry     Home Layout: One level Home Equipment: Psychologist, educational - 4 wheels;Walker - standard;Cane - quad;Cane - single point      Prior Function Level of Independence: Needs assistance   Gait / Transfers Assistance Needed: 4 falls in last 6 months;  household distances where girlfriend has had to lower him to the ground when he looses his balance  ADL's / Homemaking  Assistance Needed: assistance with all ADLs except feeding        Hand Dominance   Dominant Hand: Right    Extremity/Trunk Assessment   Upper Extremity Assessment Upper Extremity Assessment: Generalized weakness    Lower Extremity Assessment Lower Extremity Assessment: Generalized weakness       Communication   Communication: No difficulties  Cognition Arousal/Alertness: Awake/alert Behavior During Therapy: WFL for tasks assessed/performed Overall Cognitive Status: Within Functional Limits for tasks assessed       General Comments      Exercises     Assessment/Plan    PT Assessment Patient needs continued PT services  PT Problem List Decreased strength;Decreased mobility;Decreased safety awareness;Decreased activity tolerance;Decreased balance       PT Treatment Interventions DME instruction;Therapeutic exercise;Gait training;Balance training;Neuromuscular re-education;Patient/family education;Therapeutic activities    PT Goals (Current goals can be found in the Care Plan section)  Acute Rehab PT Goals Patient Stated Goal: Go to SNF to get stronger PT Goal Formulation: With patient/family Time For Goal Achievement: 04/16/20 Potential to Achieve Goals: Fair    Frequency Min 3X/week   Barriers to discharge           AM-PAC PT "6 Clicks" Mobility  Outcome Measure Help needed turning from your back to your side while in a flat bed without using bedrails?: A Little Help needed moving from lying on your back to sitting on the side of a flat bed without using bedrails?: A Lot Help needed moving to and from a bed to a chair (including a wheelchair)?: A Lot Help needed standing up from a chair using your arms (e.g., wheelchair or bedside chair)?: A Lot Help needed to walk in hospital room?: A Lot Help needed climbing 3-5 steps with a railing? : Total 6 Click Score: 12    End of Session Equipment Utilized During Treatment: Gait belt Activity Tolerance:  Patient limited by fatigue;Patient limited by lethargy Patient left: in bed;with call bell/phone within reach;with bed alarm set;with family/visitor present Nurse Communication: Mobility status PT Visit Diagnosis: Unsteadiness on feet (R26.81);Other abnormalities of gait and mobility (R26.89);Repeated falls (R29.6);Muscle weakness (generalized) (M62.81);Difficulty in walking, not elsewhere classified (R26.2)    Time: 1345-1410 PT Time Calculation (min) (ACUTE ONLY): 25 min   Charges:   PT Evaluation $PT Eval Moderate Complexity: 1 Mod PT Treatments $Therapeutic Activity: 8-22 mins        Katina Dung. Hartnett-Rands, MS, PT [facility] PT [facility] [place] [phone] 04/02/2020, 2:12 PM

## 2020-04-03 LAB — COMPREHENSIVE METABOLIC PANEL
ALT: 50 U/L — ABNORMAL HIGH (ref 0–44)
AST: 101 U/L — ABNORMAL HIGH (ref 15–41)
Albumin: 2.1 g/dL — ABNORMAL LOW (ref 3.5–5.0)
Alkaline Phosphatase: 109 U/L (ref 38–126)
Anion gap: 5 (ref 5–15)
BUN: <5 mg/dL — ABNORMAL LOW (ref 6–20)
CO2: 24 mmol/L (ref 22–32)
Calcium: 7.4 mg/dL — ABNORMAL LOW (ref 8.9–10.3)
Chloride: 95 mmol/L — ABNORMAL LOW (ref 98–111)
Creatinine, Ser: 0.36 mg/dL — ABNORMAL LOW (ref 0.61–1.24)
GFR calc Af Amer: >60 mL/min (ref >60)
GFR calc non Af Amer: >60 mL/min (ref >60)
Glucose, Bld: 139 mg/dL — ABNORMAL HIGH (ref 70–99)
Potassium: 3.4 mmol/L — ABNORMAL LOW (ref 3.5–5.1)
Sodium: 124 mmol/L — ABNORMAL LOW (ref 135–145)
Total Bilirubin: 1.1 mg/dL (ref 0.3–1.2)
Total Protein: 5.2 g/dL — ABNORMAL LOW (ref 6.5–8.1)

## 2020-04-03 LAB — PROTEIN / CREATININE RATIO, URINE
Creatinine, Urine: 101.72 mg/dL
Protein Creatinine Ratio: 0.15 mg/mg{Cre} (ref 0.00–0.15)
Total Protein, Urine: 15 mg/dL

## 2020-04-03 LAB — CBC
HCT: 26.1 % — ABNORMAL LOW (ref 39.0–52.0)
Hemoglobin: 8.9 g/dL — ABNORMAL LOW (ref 13.0–17.0)
MCH: 38.4 pg — ABNORMAL HIGH (ref 26.0–34.0)
MCHC: 34.1 g/dL (ref 30.0–36.0)
MCV: 112.5 fL — ABNORMAL HIGH (ref 80.0–100.0)
Platelets: 94 10*3/uL — ABNORMAL LOW (ref 150–400)
RBC: 2.32 MIL/uL — ABNORMAL LOW (ref 4.22–5.81)
RDW: 12.1 % (ref 11.5–15.5)
WBC: 4.1 10*3/uL (ref 4.0–10.5)
nRBC: 0 % (ref 0.0–0.2)

## 2020-04-03 LAB — SODIUM, URINE, RANDOM: Sodium, Ur: 48 mmol/L

## 2020-04-03 LAB — CREATININE, URINE, RANDOM: Creatinine, Urine: 99.75 mg/dL

## 2020-04-03 LAB — OSMOLALITY, URINE: Osmolality, Ur: 353 mOsm/kg (ref 300–900)

## 2020-04-03 LAB — MAGNESIUM: Magnesium: 1.8 mg/dL (ref 1.7–2.4)

## 2020-04-03 MED ORDER — POTASSIUM CHLORIDE CRYS ER 20 MEQ PO TBCR
40.0000 meq | EXTENDED_RELEASE_TABLET | Freq: Once | ORAL | Status: AC
  Administered 2020-04-03: 40 meq via ORAL
  Filled 2020-04-03: qty 2

## 2020-04-03 NOTE — Progress Notes (Addendum)
PROGRESS NOTE    Clifford Hunt  PJK:[id number] DOB: January 15, 1961 DOA: 03/30/2020 PCP: [facility]   Brief Narrative:  Per HPI: Clifford Phillipsis a 59 y.o.malewith a history of alcoholismwith 5-6 beers a day with little to no oral food intake. Patient was seen at [facility] and had blood work, which showed hyponatremia. The patient was called by his PCPs office and instructed to come to the emergency room for evaluation. He reports feeling weak with little to no energy. No palliating or provoking factors. His last alcohol consumption was earlier today: He had 2 beers prior to getting up. He does report some nausea and lack of appetite. He does have a longstanding history of foods not tasting right, which affects his appetite. His symptoms are unchanged. He does bruise easily, especially in his arms and hands. He has no swelling in his legs.  9/25:Patient was admitted with acute hyponatremia with serum sodium of 114 in the setting of beer [place] mania. This was noted on blood work as noted above. He was noted to have some nausea and change in appetite. His sodium levels have improved to 122 this morning and further IV fluid will be held and he will be maintained on fluid restriction for now with repeat labs in a.m. 2D echocardiogram ordered and pending. Potassium will be supplemented.  9/26:Patient continues to have some weakness but is more awake and alert today. His serum sodium remained stable at 121. Plan to restart IV normal saline. 2D echocardiogram pending. Right upper quadrant ultrasound with findings of liver cirrhosis and gallbladder sludge and stones noted. He appears to be tolerating diet. He will require PT evaluation and likely need placement to SNF/rehab.  9/27: Patient continues to remain weak with sodium levels of 123 noted today.  Plan to continue IV normal saline throughout the day at higher rate and recheck in  a.m.  Replete magnesium.  PT evaluation pending.  9/28: PT has assessed patient with recommendation for SNF noted.  His sodium levels are at 124 today.  Reiterated to nursing staff to obtain urine studies.  Discussed with nephrology regarding possibility for hypertonic saline and they have recommended continuing with normal saline for now until further studies return.  Urine protein/creatinine ratio will also be obtained to further analyze proteinuria.  Assessment & Plan:   Principal Problem:   Acute hyponatremia Active Problems:   Elevated LFTs   Alcohol abuse   Anemia, iron deficiency   Hyponatremia acute versus chronic-stable and slowly improving -Likely related tobeer potomania and poor solute intake -Continue  IV normal saline as ordered for now -Formal evaluation with nephrology by a.m. once urine and serum studies returned as well as urine sodium. -Recheck labs in a.m.  History of alcohol abuse -Continue alcohol withdrawal protocol with Librium ordered twice daily -Patient has been reluctant to pursue alcohol counseling in the past.  Transaminitis-stable -Likely secondary to alcohol abusewith noted cirrhosis on ultrasound 9/25 -Recheck labs in a.m.  Proteinuria -Hepatitis and HIV studies negative -Urine protein creatinine ratio ordered per nephrology request for further evaluation  Mild hypokalemia -Replete and reevaluate in a.m.  Anemia/thrombocytopenia-stable -Likely secondary to poor nutrition and ongoing chronic alcohol abuse -Anemia panel orderedwith no acute findings -Recheck labs in a.m. to follow trend  Ongoing tobacco abuse -Nicotine patch -Counseled on cessation  Ambulatory dysfunction -We will require SNF when ready for discharge  DVT prophylaxis:SCDs Code Status:Full Family Communication:Discussed with fianc on phone 9/28 Disposition Plan: Status is: Inpatient  Remains inpatient appropriate because:IV treatments appropriate due to  intensity of illness or inability to take PO and Inpatient level of care appropriate due to severity of illness   Dispo: The patient is from:Home Anticipated d/c is to:SNF Anticipated d/c date is: 2-3days Patient currently is not medically stable to d/c.PT recommending SNF on discharge.  Continue IV normal saline with nephrology consultation pending for 9/29.  Consultants:  Discussed with Dr. Valentino Nose on 9/28, formal consultation per nephrology by 9/29  Procedures:  See below  Antimicrobials:   None   Subjective: Patient seen and evaluated today and still has poor oral intake according to fianc at bedside.  He continues to remain weak and mostly listless.  Objective: Vitals:   04/02/20 0529 04/02/20 2129 04/03/20 0537 04/03/20 0915  BP:  101/85 (!) 141/102 107/83  Pulse: 91 (!) 104 96 (!) 116  Resp:  18 20   Temp:  98.6 F (37 C) 98.3 F (36.8 C)   TempSrc:   Oral   SpO2:  96% 100% 99%  Weight:      Height:        Intake/Output Summary (Last 24 hours) at 04/03/2020 1342 Last data filed at 04/03/2020 0644 Gross per 24 hour  Intake 1732.18 ml  Output 1250 ml  Net 482.18 ml   Filed Weights   03/30/20 1228  Weight: 62.6 kg    Examination:  General exam: Appears weak and lethargic Respiratory system: Clear to auscultation. Respiratory effort normal. Cardiovascular system: S1 & S2 heard, RRR.  Gastrointestinal system: Abdomen is nondistended, soft and nontender.  Central nervous system: Alert and awake Extremities: No edema Skin: No rashes, lesions or ulcers Psychiatry: Flat affect    Data Reviewed: I have personally reviewed following labs and imaging studies  CBC: Recent Labs  Lab 03/30/20 1256 03/31/20 0728 04/01/20 0731 04/02/20 0601 04/03/20 0337  WBC 7.4 4.1 3.5* 3.9* 4.1  NEUTROABS 5.8  --   --   --   --   HGB 10.7* 8.9* 9.0* 9.3* 8.9*  HCT 29.6* 25.3* 25.5* 26.9* 26.1*  MCV 108.4* 110.5*  111.4* 112.6* 112.5*  PLT 121* 86* 85* 89* 94*   Basic Metabolic Panel: Recent Labs  Lab 03/31/20 0130 03/31/20 0728 04/01/20 0731 04/02/20 0601 04/03/20 0337  NA 120* 122* 121* 123* 124*  K 3.4* 3.3* 3.3* 3.7 3.4*  CL 89* 88* 89* 91* 95*  CO2 22 25 25 24 24   GLUCOSE 98 90 97 98 139*  BUN 5* 6 <5* <5* <5*  CREATININE 0.39* 0.43* 0.37* 0.38* 0.36*  CALCIUM 7.5* 7.9* 7.8* 7.7* 7.4*  MG  --   --   --  1.4* 1.8   GFR: Estimated Creatinine Clearance: 89.1 mL/min (A) (by C-G formula based on SCr of 0.36 mg/dL (L)). Liver Function Tests: Recent Labs  Lab 03/30/20 1256 03/31/20 1155 04/01/20 0731 04/02/20 0601 04/03/20 0337  AST 113* 111* 87* 98* 101*  ALT 49* 44 40 45* 50*  ALKPHOS 142* 118 102 109 109  BILITOT 2.0* 1.9* 1.3* 1.1 1.1  PROT 6.5 5.4* 5.2* 5.2* 5.2*  ALBUMIN 2.6* 2.2* 2.2* 2.1* 2.1*   Recent Labs  Lab 03/30/20 1256  LIPASE 40   No results for input(s): AMMONIA in the last 168 hours. Coagulation Profile: Recent Labs  Lab 03/30/20 1934 03/31/20 0728 04/01/20 0731  INR 1.1 1.1 1.1   Cardiac Enzymes: No results for input(s): CKTOTAL, CKMB, CKMBINDEX, TROPONINI in the last 168 hours. BNP (last 3 results) No results for  input(s): PROBNP in the last 8760 hours. HbA1C: No results for input(s): HGBA1C in the last 72 hours. CBG: No results for input(s): GLUCAP in the last 168 hours. Lipid Profile: No results for input(s): CHOL, HDL, LDLCALC, TRIG, CHOLHDL, LDLDIRECT in the last 72 hours. Thyroid Function Tests: No results for input(s): TSH, T4TOTAL, FREET4, T3FREE, THYROIDAB in the last 72 hours. Anemia Panel: No results for input(s): VITAMINB12, FOLATE, FERRITIN, TIBC, IRON, RETICCTPCT in the last 72 hours. Sepsis Labs: Recent Labs  Lab 03/30/20 1256 04/01/20 0731  LATICACIDVEN 2.1* 1.0    Recent Results (from the past 240 hour(s))  SARS Coronavirus 2 by RT PCR (hospital order, performed in [facility] hospital lab) Nasopharyngeal  Nasopharyngeal Swab     Status: None   Collection Time: 03/30/20  1:37 PM   Specimen: Nasopharyngeal Swab  Result Value Ref Range Status   SARS Coronavirus 2 NEGATIVE NEGATIVE Final    Comment: (NOTE) SARS-CoV-2 target nucleic acids are NOT DETECTED.  The SARS-CoV-2 RNA is generally detectable in upper and lower respiratory specimens during the acute phase of infection. The lowest concentration of SARS-CoV-2 viral copies this assay can detect is 250 copies / mL. A negative result does not preclude SARS-CoV-2 infection and should not be used as the sole basis for treatment or other patient management decisions.  A negative result may occur with improper specimen collection / handling, submission of specimen other than nasopharyngeal swab, presence of viral mutation(s) within the areas targeted by this assay, and inadequate number of viral copies (<250 copies / mL). A negative result must be combined with clinical observations, patient history, and epidemiological information.  Fact Sheet for Patients:   BoilerBrush.com.cy  Fact Sheet for Healthcare Providers: https://pope.com/  This test is not yet approved or  cleared by the [place] FDA and has been authorized for detection and/or diagnosis of SARS-CoV-2 by FDA under an Emergency Use Authorization (EUA).  This EUA will remain in effect (meaning this test can be used) for the duration of the COVID-19 declaration under Section 564(b)(1) of the Act, 21 U.S.C. section 360bbb-3(b)(1), unless the authorization is terminated or revoked sooner.  Performed at [facility], [address], [place], [place] [address]          Radiology Studies: No results found.      Scheduled Meds: . vitamin C  500 mg Oral BID  . chlordiazePOXIDE  5 mg Oral TID  . feeding supplement (ENSURE ENLIVE)  237 mL Oral BID BM  . folic acid  1 mg Oral Daily  . LORazepam  0-4 mg Intravenous Q12H    Or  . LORazepam  0-4 mg Oral Q12H  . mouth rinse  15 mL Mouth Rinse BID  . multivitamin with minerals  1 tablet Oral Daily  . nicotine  14 mg Transdermal Daily  . PARoxetine  20 mg Oral Daily  . thiamine  100 mg Oral Daily   Or  . thiamine  100 mg Intravenous Daily   Continuous Infusions: . sodium chloride 100 mL/hr at 04/02/20 2309     LOS: 4 days    Time spent: 30 minutes    Clifford Karim Hoover Brunette, DO [facility]  If 7PM-7AM, please contact night-coverage [email or web address] 04/03/2020, 1:42 PM

## 2020-04-04 ENCOUNTER — Inpatient Hospital Stay (HOSPITAL_COMMUNITY): Payer: Medicare PPO

## 2020-04-04 DIAGNOSIS — F1010 Alcohol abuse, uncomplicated: Secondary | ICD-10-CM

## 2020-04-04 DIAGNOSIS — D508 Other iron deficiency anemias: Secondary | ICD-10-CM

## 2020-04-04 DIAGNOSIS — R7989 Other specified abnormal findings of blood chemistry: Secondary | ICD-10-CM

## 2020-04-04 LAB — CBC
HCT: 27 % — ABNORMAL LOW (ref 39.0–52.0)
Hemoglobin: 9.2 g/dL — ABNORMAL LOW (ref 13.0–17.0)
MCH: 38.5 pg — ABNORMAL HIGH (ref 26.0–34.0)
MCHC: 34.1 g/dL (ref 30.0–36.0)
MCV: 113 fL — ABNORMAL HIGH (ref 80.0–100.0)
Platelets: 105 10*3/uL — ABNORMAL LOW (ref 150–400)
RBC: 2.39 MIL/uL — ABNORMAL LOW (ref 4.22–5.81)
RDW: 12.5 % (ref 11.5–15.5)
WBC: 4 10*3/uL (ref 4.0–10.5)
nRBC: 0 % (ref 0.0–0.2)

## 2020-04-04 LAB — COMPREHENSIVE METABOLIC PANEL
ALT: 49 U/L — ABNORMAL HIGH (ref 0–44)
AST: 82 U/L — ABNORMAL HIGH (ref 15–41)
Albumin: 2 g/dL — ABNORMAL LOW (ref 3.5–5.0)
Alkaline Phosphatase: 97 U/L (ref 38–126)
Anion gap: 6 (ref 5–15)
BUN: <5 mg/dL — ABNORMAL LOW (ref 6–20)
CO2: 22 mmol/L (ref 22–32)
Calcium: 7.5 mg/dL — ABNORMAL LOW (ref 8.9–10.3)
Chloride: 96 mmol/L — ABNORMAL LOW (ref 98–111)
Creatinine, Ser: 0.36 mg/dL — ABNORMAL LOW (ref 0.61–1.24)
GFR calc Af Amer: >60 mL/min (ref >60)
GFR calc non Af Amer: >60 mL/min (ref >60)
Glucose, Bld: 97 mg/dL (ref 70–99)
Potassium: 3.7 mmol/L (ref 3.5–5.1)
Sodium: 124 mmol/L — ABNORMAL LOW (ref 135–145)
Total Bilirubin: 1.4 mg/dL — ABNORMAL HIGH (ref 0.3–1.2)
Total Protein: 4.9 g/dL — ABNORMAL LOW (ref 6.5–8.1)

## 2020-04-04 LAB — GLUCOSE, CAPILLARY: Glucose-Capillary: 135 mg/dL — ABNORMAL HIGH (ref 70–99)

## 2020-04-04 LAB — CORTISOL: Cortisol, Plasma: 26 ug/dL

## 2020-04-04 LAB — URIC ACID: Uric Acid, Serum: 2 mg/dL — ABNORMAL LOW (ref 3.7–8.6)

## 2020-04-04 LAB — TSH: TSH: 2.672 u[IU]/mL (ref 0.350–4.500)

## 2020-04-04 LAB — OSMOLALITY: Osmolality: 259 mOsm/kg — ABNORMAL LOW (ref 275–295)

## 2020-04-04 LAB — MAGNESIUM: Magnesium: 1.7 mg/dL (ref 1.7–2.4)

## 2020-04-04 LAB — SODIUM: Sodium: 124 mmol/L — ABNORMAL LOW (ref 135–145)

## 2020-04-04 MED ORDER — FUROSEMIDE 10 MG/ML IJ SOLN
20.0000 mg | Freq: Once | INTRAMUSCULAR | Status: AC
  Administered 2020-04-04: 20 mg via INTRAVENOUS
  Filled 2020-04-04: qty 2

## 2020-04-04 NOTE — Consult Note (Signed)
Nephrology Consult   Requesting provider: [facility] Service requesting consult: Hospitalist Reason for consult: Hyponatremia   Assessment/Recommendations: Clifford Hunt is a/an 59 y.o. male with a past medical history of alcohol use disorder who present w/ hyponatremia  Euvolemic hyponatremia: Some concern for volume depletion over the last few days now appears euvolemic on my exam.  Urine studies yesterday with a urine osmolarity of 353 and a urine sodium 48 while receiving IV normal saline. Likely multifactorial with poor solute intake and excessive fluid intake with alcohol being the main contributors.  However, now sodium has not improved despite ongoing solute administration with normal saline.  Possible underlying SIADH (malignancy?)  But will screen for other potential etiologies.  Liver disease is also possible given chronic alcohol use disorder.  Low uric acid as well as BUN and creatinine suggest ongoing free water retention -Stop normal saline now that the patient is euvolemic -Limit free water intake to less than 1.8 L daily -Repeat urine studies tomorrow once off fluids for about 24 hours -Obtain TSH and cortisol -Obtain serum osmolarity, abdominal ultrasound to assess for liver disease, and chest x-ray to look for possible pulmonary malignancy -Continue monitor sodium twice daily -We will consider sodium tablets if sodium worsens  Alcohol use disorder: Chronic misuse of alcohol.  Counseled on cessation  LFT abnormalities: AST and ALT elevated most likely related to alcohol use.  Albumin low which could indicate malnutrition or underlying liver disease.  Obtaining liver ultrasound as above as well as INR tomorrow  Bicytopenia/pancytopenia: Anemia and thrombocytopenia with intermittently low white blood cell count.  Most likely related to alcohol use disorder causing bone marrow suppression. Vit B 12 normal. On folate. Thrombocytopenia could also be from liver disease.  CTM  Tobacco use disorder: Counseled on cessation  Hypokalemia: Has improved with potassium supplementation.  Level of 3.7 today   Recommendations conveyed to primary service.    Darnell Level [facility] 04/04/2020 9:30 AM   _____________________________________________________________________________________ CC: Hyponatremia  History of Present Illness: Clifford Hunt is a/an 59 y.o. male with a past medical history of alcohol use disorder who presents with hyponatremia.  Patient presented to the hospital on 9/24.  Patient was seen by family medicine in the outpatient setting was found to have hyponatremia and sent to the emergency department.  The patient reported having little energy for several days and had been drinking alcohol consistently.  He states that he drinks about six beers per day.  He feels that his appetite was relatively normal but is unable to quantify how much he was eating at home.  He also had some nausea.  He states that he bruises easily.  He has never had yellowing of his eyes or skin.  He does not have edema.  In the emergency department he was found to have a sodium of 114.  Urine studies were not able to be obtained.  He was started on IV fluids.  After admission his sodium corrected steadily with IV normal saline.  Over the last 48 hours his sodium has plateaued around 124.  The patient states that his confusion and appetite have significantly improved.  He basically feels back to normal.  He denies any nausea, vomiting, poor appetite.   Medications:  Current Facility-Administered Medications  Medication Dose Route Frequency Provider Last Rate Last Admin  . alum & mag hydroxide-simeth (MAALOX/MYLANTA) 200-200-20 MG/5ML suspension 30 mL  30 mL Oral Q4H PRN Sherryll Burger, Pratik D, DO   30 mL at 03/31/20  2222  . ascorbic acid (VITAMIN C) tablet 500 mg  500 mg Oral BID Sherryll Burger, Pratik D, DO   500 mg at 04/04/20 0830  . chlordiazePOXIDE (LIBRIUM) capsule  5 mg  5 mg Oral TID Sherryll Burger, Pratik D, DO   5 mg at 04/04/20 0831  . feeding supplement (ENSURE ENLIVE) (ENSURE ENLIVE) liquid 237 mL  237 mL Oral BID BM Levie Heritage, DO   237 mL at 04/04/20 3704  . folic acid (FOLVITE) tablet 1 mg  1 mg Oral Daily Sherryll Burger, Pratik D, DO   1 mg at 04/04/20 0830  . MEDLINE mouth rinse  15 mL Mouth Rinse BID Levie Heritage, DO   15 mL at 04/04/20 8889  . metoprolol tartrate (LOPRESSOR) injection 10 mg  10 mg Intravenous Q6H PRN Sherryll Burger, Pratik D, DO      . multivitamin with minerals tablet 1 tablet  1 tablet Oral Daily Sherryll Burger, Pratik D, DO   1 tablet at 04/04/20 0830  . nicotine (NICODERM CQ - dosed in mg/24 hours) patch 14 mg  14 mg Transdermal Daily Levie Heritage, DO   14 mg at 04/04/20 1694  . ondansetron (ZOFRAN) tablet 4 mg  4 mg Oral Q6H PRN Levie Heritage, DO       Or  . ondansetron [facility]) injection 4 mg  4 mg Intravenous Q6H PRN Levie Heritage, DO      . PARoxetine (PAXIL) tablet 20 mg  20 mg Oral Daily Levie Heritage, DO   20 mg at 04/04/20 0830  . thiamine tablet 100 mg  100 mg Oral Daily Levie Heritage, DO   100 mg at 04/04/20 0830   Or  . thiamine (B-1) injection 100 mg  100 mg Intravenous Daily Levie Heritage, DO   100 mg at 03/30/20 1341     ALLERGIES Morphine and related  MEDICAL HISTORY Past Medical History:  Diagnosis Date  . Hypertension   . Liver damage      SOCIAL HISTORY Social History   Socioeconomic History  . Marital status: Significant Other    Spouse name: Not on file  . Number of children: Not on file  . Years of education: Not on file  . Highest education level: Not on file  Occupational History  . Not on file  Tobacco Use  . Smoking status: Current Every Day Smoker    Packs/day: 1.00    Types: Cigarettes  . Smokeless tobacco: Never Used  Substance and Sexual Activity  . Alcohol use: Yes    Comment: social  . Drug use: No  . Sexual activity: Not on file  Other Topics Concern  . Not on file  Social  History Narrative  . Not on file   Social Determinants of Health   Financial Resource Strain:   . Difficulty of Paying Living Expenses: Not on file  Food Insecurity:   . Worried About Programme researcher, broadcasting/film/video in the Last Year: Not on file  . Ran Out of Food in the Last Year: Not on file  Transportation Needs:   . Lack of Transportation (Medical): Not on file  . Lack of Transportation (Non-Medical): Not on file  Physical Activity:   . Days of Exercise per Week: Not on file  . Minutes of Exercise per Session: Not on file  Stress:   . Feeling of Stress : Not on file  Social Connections:   . Frequency of Communication with Friends and Family: Not on file  .  Frequency of Social Gatherings with Friends and Family: Not on file  . Attends Religious Services: Not on file  . Active Member of Clubs or Organizations: Not on file  . Attends Banker Meetings: Not on file  . Marital Status: Not on file  Intimate Partner Violence:   . Fear of Current or Ex-Partner: Not on file  . Emotionally Abused: Not on file  . Physically Abused: Not on file  . Sexually Abused: Not on file     FAMILY HISTORY Father had heart disease   Review of Systems: 12 systems reviewed Otherwise as per HPI, all other systems reviewed and negative  Physical Exam: Vitals:   04/04/20 0023 04/04/20 0550  BP: (!) 129/92 117/87  Pulse: 99 (!) 103  Resp:  19  Temp: 98 F (36.7 C) 98.6 F (37 C)  SpO2: 99% 98%   No intake/output data recorded.  Intake/Output Summary (Last 24 hours) at 04/04/2020 0930 Last data filed at 04/04/2020 0550 Gross per 24 hour  Intake --  Output 700 ml  Net -700 ml   General: Chronically ill-appearing, no distress HEENT: anicteric sclera, oropharynx clear without lesions CV: Normal rate, no murmurs, no peripheral edema Lungs: Bilateral chest rise, normal work of breathing Abd: soft, non-tender, non-distended Skin: no visible lesions or rashes Psych: Awake, alert,  appropriate mood and affect Musculoskeletal: No obvious deformities, no joint effusions Neuro: Active resting tremor, normal speech, no gross focal deficits   Test Results Reviewed Lab Results  Component Value Date   NA 124 (L) 04/04/2020   K 3.7 04/04/2020   CL 96 (L) 04/04/2020   CO2 22 04/04/2020   BUN <5 (L) 04/04/2020   CREATININE 0.36 (L) 04/04/2020   CALCIUM 7.5 (L) 04/04/2020   ALBUMIN 2.0 (L) 04/04/2020     I have reviewed all relevant outside healthcare records related to the patient's current hospitalization

## 2020-04-04 NOTE — Progress Notes (Signed)
Physical Therapy Treatment Patient Details Name: Clifford Hunt MRN: [id number] DOB: Nov 29, 1960 Today's Date: 04/04/2020    History of Present Illness Clifford Hunt is a 59 y.o. male with a history of alcoholism with 5-6 beers a day with little to no oral food intake.  Patient was seen at [facility] and had blood work, which showed hyponatremia.  The patient was called by his PCPs office and instructed to come to the emergency room for evaluation.  He reports feeling weak with little to no energy.  No palliating or provoking factors.  His last alcohol consumption was earlier today: He had 2 beers prior to getting up.  He does report some nausea and lack of appetite.  He does have a longstanding history of foods not tasting right, which affects his appetite.  His symptoms are unchanged.  He does bruise easily, especially in his arms and hands.  He has no swelling in his legs.    PT Comments    Patient requires much time demonstrating slow labored movement for sitting up at bedside, frequent rest breaks and Mod assist, demonstrates fair/good return for completing BLE ROM/strengthening exercises with verbal cueing and demonstration while seated at bedside and limited to a few side steps at bedside due to BLE weakness/fall risk.  Patient tolerated sitting up in chair after therapy - RN notified.  Patient will benefit from continued physical therapy in hospital and recommended venue below to increase strength, balance, endurance for safe ADLs and gait.    Follow Up Recommendations  SNF;Supervision/Assistance - 24 hour     Equipment Recommendations  None recommended by PT    Recommendations for Other Services       Precautions / Restrictions Precautions Precautions: Fall Restrictions Weight Bearing Restrictions: No    Mobility  Bed Mobility Overal bed mobility: Needs Assistance Bed Mobility: Supine to Sit     Supine to sit: Mod assist     General bed mobility  comments: slow labored movement, difficulty sitting up due to weaknes  Transfers Overall transfer level: Needs assistance Equipment used: Rolling walker (2 wheeled) Transfers: Sit to/from [employer] Sit to Stand: Min assist;Mod assist Stand pivot transfers: Mod assist       General transfer comment: slow labored movement, buckling of knees due to weakness  Ambulation/Gait Ambulation/Gait assistance: Mod assist;Max assist Gait Distance (Feet): 4 Feet Assistive device: Rolling walker (2 wheeled) Gait Pattern/deviations: Step-to pattern;Decreased step length - right;Decreased step length - left;Decreased stride length;Trunk flexed;Leaning posteriorly Gait velocity: decreased   General Gait Details: limited to 4-5 slow labored unsteady side steps due to poor standing balance/BLE weakness   Stairs             Wheelchair Mobility    Modified Rankin (Stroke Patients Only)       Balance Overall balance assessment: Needs assistance Sitting-balance support: Feet supported;Bilateral upper extremity supported Sitting balance-Leahy Scale: Fair Sitting balance - Comments: fair/poor without BUE support   Standing balance support: During functional activity;Bilateral upper extremity supported Standing balance-Leahy Scale: Poor Standing balance comment: using RW                            Cognition Arousal/Alertness: Awake/alert Behavior During Therapy: WFL for tasks assessed/performed Overall Cognitive Status: Within Functional Limits for tasks assessed  Exercises General Exercises - Lower Extremity Long Arc Quad: Seated;AROM;Strengthening;Both;10 reps Hip Flexion/Marching: Seated;AROM;Strengthening;Both;10 reps Toe Raises: Seated;AROM;Strengthening;Both;10 reps Heel Raises: Seated;AROM;Strengthening;Both;10 reps    General Comments        Pertinent Vitals/Pain Pain Assessment:  No/denies pain    Home Living                      Prior Function            PT Goals (current goals can now be found in the care plan section) Acute Rehab PT Goals Patient Stated Goal: Go to SNF to get stronger PT Goal Formulation: With patient/family Time For Goal Achievement: 04/16/20 Potential to Achieve Goals: Good Progress towards PT goals: Progressing toward goals    Frequency    Min 3X/week      PT Plan Current plan remains appropriate    Co-evaluation              AM-PAC PT "6 Clicks" Mobility   Outcome Measure  Help needed turning from your back to your side while in a flat bed without using bedrails?: A Lot Help needed moving from lying on your back to sitting on the side of a flat bed without using bedrails?: A Lot Help needed moving to and from a bed to a chair (including a wheelchair)?: A Lot Help needed standing up from a chair using your arms (e.g., wheelchair or bedside chair)?: A Lot Help needed to walk in hospital room?: A Lot Help needed climbing 3-5 steps with a railing? : Total 6 Click Score: 11    End of Session   Activity Tolerance: Patient tolerated treatment well;Patient limited by fatigue Patient left: in chair;with call bell/phone within reach;with chair alarm set Nurse Communication: Mobility status PT Visit Diagnosis: Unsteadiness on feet (R26.81);Other abnormalities of gait and mobility (R26.89);Repeated falls (R29.6);Muscle weakness (generalized) (M62.81);Difficulty in walking, not elsewhere classified (R26.2)     Time: 1093-2355 PT Time Calculation (min) (ACUTE ONLY): 26 min  Charges:  $Therapeutic Exercise: 8-22 mins $Therapeutic Activity: 8-22 mins                     1:53 PM, 04/04/20 Clifford Hunt, MPT Physical Therapist with [facility] 336 [phone] office [phone] mobile phone

## 2020-04-04 NOTE — Progress Notes (Signed)
PROGRESS NOTE    Clifford Hunt  YIF:[id number] DOB: May 07, 1961 DOA: 03/30/2020 PCP: [facility]   Brief Narrative:  Per HPI: Clifford Phillipsis a 59 y.o.malewith a history of alcoholismwith 5-6 beers a day with little to no oral food intake. Patient was seen at [facility] and had blood work, which showed hyponatremia. The patient was called by his PCPs office and instructed to come to the emergency room for evaluation. He reports feeling weak with little to no energy. No palliating or provoking factors. His last alcohol consumption was earlier today: He had 2 beers prior to getting up. He does report some nausea and lack of appetite. He does have a longstanding history of foods not tasting right, which affects his appetite. His symptoms are unchanged. He does bruise easily, especially in his arms and hands. He has no swelling in his legs.  9/25:Patient was admitted with acute hyponatremia with serum sodium of 114 in the setting of beer [place] mania. This was noted on blood work as noted above. He was noted to have some nausea and change in appetite. His sodium levels have improved to 122 this morning and further IV fluid will be held and he will be maintained on fluid restriction for now with repeat labs in a.m. 2D echocardiogram ordered and pending. Potassium will be supplemented.  9/26:Patient continues to have some weakness but is more awake and alert today. His serum sodium remained stable at 121. Plan to restart IV normal saline. 2D echocardiogram pending. Right upper quadrant ultrasound with findings of liver cirrhosis and gallbladder sludge and stones noted. He appears to be tolerating diet. He will require PT evaluation and likely need placement to SNF/rehab.  9/27: Patient continues to remain weak with sodium levels of 123 noted today.  Plan to continue IV normal saline throughout the day at higher rate and recheck in  a.m.  Replete magnesium.  PT evaluation pending.  9/28: PT has assessed patient with recommendation for SNF noted.  His sodium levels are at 124 today.  Reiterated to nursing staff to obtain urine studies.  Discussed with nephrology regarding possibility for hypertonic saline and they have recommended continuing with normal saline for now until further studies return.  Urine protein/creatinine ratio will also be obtained to further analyze proteinuria.  9/29: sodium unchanged. Nephrology consultation.    Assessment & Plan:   Principal Problem:   Acute hyponatremia Active Problems:   Elevated LFTs   Alcohol abuse   Anemia, iron deficiency   Hyponatremia acute versus chronic-stable and slowly improving -Likely related tobeer potomania and poor solute intake -completed IV normal saline and now discontinued as patient is euvolemic -appreciate nephrology consultation -Follow labs.   History of alcohol abuse -Continue alcohol withdrawal protocol with Librium  -Patient has been reluctant to pursue alcohol treatment program in the past.  Transaminitis-stable -Likely secondary to alcohol abusewith noted cirrhosis on ultrasound 9/25 -Recheck labs in a.m.  Proteinuria -Hepatitis and HIV studies negative -Urine protein creatinine ratio ordered per nephrology request for further evaluation  Mild hypokalemia -Repleted. Follow  Anemia/thrombocytopenia-stable -Likely secondary to poor nutrition and ongoing chronic alcohol abuse -Anemia panel orderedwith no acute findings -Follow  Ongoing tobacco abuse -Nicotine patch -Counseled on cessation  Ambulatory dysfunction -SNF when ready for discharge  DVT prophylaxis:SCDs Code Status:Full Family Communication:Discussed with fianc on phone Disposition Plan: Status is: Inpatient  Remains inpatient appropriate because:IV treatments appropriate due to intensity of illness or inability to take PO and Inpatient  level of care  appropriate due to severity of illness   Dispo: The patient is from:Home Anticipated d/c is to:SNF Anticipated d/c date is: 2days Patient currently is not medically stable to d/c.PT recommending SNF on discharge.  Continue with nephrology consultation pending for 9/29.  Consultants:  Discussed with Dr. Valentino Nose 9/29  Procedures:  See below  Antimicrobials:   None   Subjective: Patient remains very weak and deconditioned.  Agreeable to SNF placement.   Objective: Vitals:   04/03/20 2015 04/04/20 0023 04/04/20 0550 04/04/20 0900  BP: 107/78 (!) 129/92 117/87 (!) 124/91  Pulse: 99 99 (!) 103 97  Resp: 20  19   Temp: 99 F (37.2 C) 98 F (36.7 C) 98.6 F (37 C)   TempSrc: Oral Oral Oral   SpO2: 100% 99% 98%   Weight:      Height:        Intake/Output Summary (Last 24 hours) at 04/04/2020 1217 Last data filed at 04/04/2020 0550 Gross per 24 hour  Intake --  Output 700 ml  Net -700 ml   Filed Weights   03/30/20 1228  Weight: 62.6 kg    Examination:  General exam: chronically ill appearing male, Appears weak and lethargic Respiratory system: Clear to auscultation. Respiratory effort normal. Cardiovascular system: normal S1 & S2 heard.  Gastrointestinal system: Abdomen is nondistended, soft and nontender.  Central nervous system: Alert and awake Extremities: No edema Skin: No rashes, lesions or ulcers Psychiatry: Flat affect  Data Reviewed: I have personally reviewed following labs and imaging studies  CBC: Recent Labs  Lab 03/30/20 1256 03/30/20 1256 03/31/20 0728 04/01/20 0731 04/02/20 0601 04/03/20 0337 04/04/20 0655  WBC 7.4   < > 4.1 3.5* 3.9* 4.1 4.0  NEUTROABS 5.8  --   --   --   --   --   --   HGB 10.7*   < > 8.9* 9.0* 9.3* 8.9* 9.2*  HCT 29.6*   < > 25.3* 25.5* 26.9* 26.1* 27.0*  MCV 108.4*   < > 110.5* 111.4* 112.6* 112.5* 113.0*  PLT 121*   < > 86* 85* 89* 94* 105*   < > = values in  this interval not displayed.   Basic Metabolic Panel: Recent Labs  Lab 03/31/20 0728 04/01/20 0731 04/02/20 0601 04/03/20 0337 04/04/20 0655  NA 122* 121* 123* 124* 124*  K 3.3* 3.3* 3.7 3.4* 3.7  CL 88* 89* 91* 95* 96*  CO2 25 25 24 24 22   GLUCOSE 90 97 98 139* 97  BUN 6 <5* <5* <5* <5*  CREATININE 0.43* 0.37* 0.38* 0.36* 0.36*  CALCIUM 7.9* 7.8* 7.7* 7.4* 7.5*  MG  --   --  1.4* 1.8 1.7   GFR: Estimated Creatinine Clearance: 89.1 mL/min (A) (by C-G formula based on SCr of 0.36 mg/dL (L)). Liver Function Tests: Recent Labs  Lab 03/31/20 1155 04/01/20 0731 04/02/20 0601 04/03/20 0337 04/04/20 0655  AST 111* 87* 98* 101* 82*  ALT 44 40 45* 50* 49*  ALKPHOS 118 102 109 109 97  BILITOT 1.9* 1.3* 1.1 1.1 1.4*  PROT 5.4* 5.2* 5.2* 5.2* 4.9*  ALBUMIN 2.2* 2.2* 2.1* 2.1* 2.0*   Recent Labs  Lab 03/30/20 1256  LIPASE 40   No results for input(s): AMMONIA in the last 168 hours. Coagulation Profile: Recent Labs  Lab 03/30/20 1934 03/31/20 0728 04/01/20 0731  INR 1.1 1.1 1.1   Cardiac Enzymes: No results for input(s): CKTOTAL, CKMB, CKMBINDEX, TROPONINI in the last 168 hours.  BNP (last 3 results) No results for input(s): PROBNP in the last 8760 hours. HbA1C: No results for input(s): HGBA1C in the last 72 hours. CBG: No results for input(s): GLUCAP in the last 168 hours. Lipid Profile: No results for input(s): CHOL, HDL, LDLCALC, TRIG, CHOLHDL, LDLDIRECT in the last 72 hours. Thyroid Function Tests: Recent Labs    04/04/20 0936  TSH 2.672   Anemia Panel: No results for input(s): VITAMINB12, FOLATE, FERRITIN, TIBC, IRON, RETICCTPCT in the last 72 hours. Sepsis Labs: Recent Labs  Lab 03/30/20 1256 04/01/20 0731  LATICACIDVEN 2.1* 1.0    Recent Results (from the past 240 hour(s))  SARS Coronavirus 2 by RT PCR (hospital order[facility]) Nasopharyngeal Nasopharyngeal Swab     Status: None   Collection Time: 03/30/20  1:37 PM    Specimen: Nasopharyngeal Swab  Result Value Ref Range Status   SARS Coronavirus 2 NEGATIVE NEGATIVE Final    Comment: (NOTE) SARS-CoV-2 target nucleic acids are NOT DETECTED.  The SARS-CoV-2 RNA is generally detectable in upper and lower respiratory specimens during the acute phase of infection. The lowest concentration of SARS-CoV-2 viral copies this assay can detect is 250 copies / mL. A negative result does not preclude SARS-CoV-2 infection and should not be used as the sole basis for treatment or other patient management decisions.  A negative result may occur with improper specimen collection / handling, submission of specimen other than nasopharyngeal swab, presence of viral mutation(s) within the areas targeted by this assay, and inadequate number of viral copies (<250 copies / mL). A negative result must be combined with clinical observations, patient history, and epidemiological information.  Fact Sheet for Patients:   BoilerBrush.com.cyhttps://www.fda.gov/media/136312/download  Fact Sheet for Healthcare Providers: https://pope.com/https://www.fda.gov/media/136313/download  This test is not yet approved or  cleared by the [place] FDA and has been authorized for detection and/or diagnosis of SARS-CoV-2 by FDA under an Emergency Use Authorization (EUA).  This EUA will remain in effect (meaning this test can be used) for the duration of the COVID-19 declaration under Section 564(b)(1) of the Act, 21 U.S.C. section 360bbb-3(b)(1), unless the authorization is terminated or revoked sooner.  Performed at [facility], [address] [place], [place] [address]    Radiology Studies: No results found.  Scheduled Meds: . vitamin C  500 mg Oral BID  . chlordiazePOXIDE  5 mg Oral TID  . feeding supplement (ENSURE ENLIVE)  237 mL Oral BID BM  . folic acid  1 mg Oral Daily  . mouth rinse  15 mL Mouth Rinse BID  . multivitamin with minerals  1 tablet Oral Daily  . nicotine  14 mg Transdermal Daily  .  PARoxetine  20 mg Oral Daily  . thiamine  100 mg Oral Daily   Or  . thiamine  100 mg Intravenous Daily   Continuous Infusions:   LOS: 5 days   Time spent: 30 minutes  Sarthak Rubenstein Laural BenesJohnson, MD How to contact the [facility] Attending or Consulting provider 7A - 7P or covering provider during after hours 7P -7A, for this patient?  1. Check the care team in [facility] and look for a) attending/consulting [facility] provider listed and b) the [facility] team listed 2. Log into [email or web address] and use [place]'s universal password to access. If you do not have the password, please contact the hospital operator. 3. Locate the [facility] provider you are looking for under [facility] and page to a number that you can be directly reached. 4.  If you still have difficulty reaching the provider, please page the [facility] (Director on Call) for the Hospitalists listed on amion for assistance.  [facility]  If 7PM-7AM, please contact night-coverage [email or web address] 04/04/2020, 12:17 PM

## 2020-04-05 LAB — BASIC METABOLIC PANEL
Anion gap: 6 (ref 5–15)
BUN: 5 mg/dL — ABNORMAL LOW (ref 6–20)
CO2: 21 mmol/L — ABNORMAL LOW (ref 22–32)
Calcium: 7.8 mg/dL — ABNORMAL LOW (ref 8.9–10.3)
Chloride: 98 mmol/L (ref 98–111)
Creatinine, Ser: 0.34 mg/dL — ABNORMAL LOW (ref 0.61–1.24)
GFR calc Af Amer: >60 mL/min (ref >60)
GFR calc non Af Amer: >60 mL/min (ref >60)
Glucose, Bld: 98 mg/dL (ref 70–99)
Potassium: 3.5 mmol/L (ref 3.5–5.1)
Sodium: 125 mmol/L — ABNORMAL LOW (ref 135–145)

## 2020-04-05 LAB — SODIUM: Sodium: 126 mmol/L — ABNORMAL LOW (ref 135–145)

## 2020-04-05 LAB — SODIUM, URINE, RANDOM: Sodium, Ur: 39 mmol/L

## 2020-04-05 LAB — MAGNESIUM: Magnesium: 1.6 mg/dL — ABNORMAL LOW (ref 1.7–2.4)

## 2020-04-05 LAB — OSMOLALITY, URINE: Osmolality, Ur: 157 mOsm/kg — ABNORMAL LOW (ref 300–900)

## 2020-04-05 LAB — PROTIME-INR
INR: 1.1 (ref 0.8–1.2)
Prothrombin Time: 14.2 seconds (ref 11.4–15.2)

## 2020-04-05 MED ORDER — POTASSIUM CHLORIDE 20 MEQ PO PACK
20.0000 meq | PACK | Freq: Two times a day (BID) | ORAL | Status: AC
  Administered 2020-04-05 – 2020-04-06 (×4): 20 meq via ORAL
  Filled 2020-04-05 (×4): qty 1

## 2020-04-05 MED ORDER — MAGNESIUM SULFATE 2 GM/50ML IV SOLN
2.0000 g | Freq: Once | INTRAVENOUS | Status: AC
  Administered 2020-04-05: 2 g via INTRAVENOUS
  Filled 2020-04-05: qty 50

## 2020-04-05 MED ORDER — FUROSEMIDE 10 MG/ML IJ SOLN
20.0000 mg | Freq: Two times a day (BID) | INTRAMUSCULAR | Status: DC
  Administered 2020-04-05 – 2020-04-06 (×3): 20 mg via INTRAVENOUS
  Filled 2020-04-05 (×2): qty 2

## 2020-04-05 NOTE — Progress Notes (Signed)
PROGRESS NOTE    [name]  ZOX:[id number] DOB: 09/14/1960 DOA: 03/30/2020 [facility]   Brief Narrative:  Per HPI: Clifford DistanceRodney Phillipsis a 59 y.o.malewith a history of alcoholismwith 5-6 beers a day with little to no oral food intake. [facility] and had blood work, which showed hyponatremia. The patient was called by his PCPs office and instructed to come to the emergency room for evaluation. He reports feeling weak with little to no energy. No palliating or provoking factors. His last alcohol consumption was earlier today: He had 2 beers prior to getting up. He does report some nausea and lack of appetite. He does have a longstanding history of foods not tasting right, which affects his appetite. His symptoms are unchanged. He does bruise easily, especially in his arms and hands. He has no swelling in his legs.  9/25:Patient was admitted with acute hyponatremia with serum sodium of 114 in the setting of beer [place] mania. This was noted on blood work as noted above. He was noted to have some nausea and change in appetite. His sodium levels have improved to 122 this morning and further IV fluid will be held and he will be maintained on fluid restriction for now with repeat labs in a.m. 2D echocardiogram ordered and pending. Potassium will be supplemented.  9/26:Patient continues to have some weakness but is more awake and alert today. His serum sodium remained stable at 121. Plan to restart IV normal saline. 2D echocardiogram pending. Right upper quadrant ultrasound with findings of liver cirrhosis and gallbladder sludge and stones noted. He appears to be tolerating diet. He will require PT evaluation and likely need placement to SNF/rehab.  9/27: Patient continues to remain weak with sodium levels of 123 noted today.  Plan to continue IV normal saline throughout the day at higher rate and recheck in  a.m.  Replete magnesium.  PT evaluation pending.  9/28: PT has assessed patient with recommendation for SNF noted.  His sodium levels are at 124 today.  Reiterated to nursing staff to obtain urine studies.  Discussed with nephrology regarding possibility for hypertonic saline and they have recommended continuing with normal saline for now until further studies return.  Urine protein/creatinine ratio will also be obtained to further analyze proteinuria.  9/29: sodium unchanged. Nephrology consultation.    9/30: sodium slightly improved.  Diuretics started by nephrology team.   Assessment & Plan:   Principal Problem:   Acute hyponatremia Active Problems:   Elevated LFTs   Alcohol abuse   Anemia, iron deficiency  Hyponatremia acute versus chronic-stable and slowly improving -Likely related tobeer potomania and poor solute intake -completed IV normal saline and now discontinued as patient is euvolemic -appreciate nephrology consultation -Follow labs.   History of alcohol abuse -Continue alcohol withdrawal protocol with Librium  -Patient has been reluctant to pursue alcohol treatment program in the past.  Transaminitis-stable -Likely secondary to alcohol abusewith noted cirrhosis on ultrasound 9/25  Proteinuria -Hepatitis and HIV studies negative -Urine protein creatinine ratio ordered per nephrology request for further evaluation  Mild hypokalemia -Repleted. Follow  Liver cirrhosis - diuretics started 9/30 by nephrology team -outpatient GI follow up recommended. -alcohol cessation advised  Anemia/thrombocytopenia-stable -Likely secondary to poor nutrition and ongoing chronic alcohol abuse -Anemia panel orderedwith no acute findings -Follow  Ongoing tobacco abuse -Nicotine patch -Counseled on cessation  Ambulatory dysfunction -SNF when ready for discharge  DVT prophylaxis:SCDs Code Status:Full Family Communication:Discussed with fianc on  phone Disposition Plan:SNF Status is: Inpatient  Remains inpatient appropriate because:IV treatments appropriate due to intensity of illness or inability to take PO and Inpatient level of care appropriate due to severity of illness   Dispo: The patient is from:Home Anticipated d/c is to:SNF Anticipated d/c date is: 2days Patient currently is not medically stable to d/c.PT recommending SNF on discharge.  Nephrology team working on improving sodium levels.   Consultants:  Discussed with Dr. Valentino Nose 9/29  Procedures:  See below  Antimicrobials:   None   Subjective: Patient remains very weak and deconditioned.  Agreeable to SNF placement.   Objective: Vitals:   04/04/20 2300 04/05/20 0500 04/05/20 0751 04/05/20 1447  BP: (!) 124/91 114/86 122/74 92/74  Pulse: 80 98 92 100  Resp: 18 18  17   Temp: 98.4 F (36.9 C) 98.1 F (36.7 C)  97.9 F (36.6 C)  TempSrc: Oral Oral  Oral  SpO2: 100% 99%  94%  Weight:      Height:        Intake/Output Summary (Last 24 hours) at 04/05/2020 1541 Last data filed at 04/05/2020 1300 Gross per 24 hour  Intake 1200 ml  Output 1300 ml  Net -100 ml   Filed Weights   03/30/20 1228  Weight: 62.6 kg    Examination:  General exam: chronically ill appearing male, Appears weak and lethargic Respiratory system: Clear to auscultation. Respiratory effort normal. Cardiovascular system: normal S1 & S2 heard.  Gastrointestinal system: Abdomen is nondistended, soft and nontender.  Central nervous system: Alert and awake Extremities: No edema Skin: No rashes, lesions or ulcers Psychiatry: Flat affect  Data Reviewed: I have personally reviewed following labs and imaging studies  CBC: Recent Labs  Lab 03/30/20 1256 03/30/20 1256 03/31/20 0728 04/01/20 0731 04/02/20 0601 04/03/20 0337 04/04/20 0655  WBC 7.4   < > 4.1 3.5* 3.9* 4.1 4.0  NEUTROABS 5.8  --   --   --   --   --   --    HGB 10.7*   < > 8.9* 9.0* 9.3* 8.9* 9.2*  HCT 29.6*   < > 25.3* 25.5* 26.9* 26.1* 27.0*  MCV 108.4*   < > 110.5* 111.4* 112.6* 112.5* 113.0*  PLT 121*   < > 86* 85* 89* 94* 105*   < > = values in this interval not displayed.   Basic Metabolic Panel: Recent Labs  Lab 04/01/20 0731 04/01/20 0731 04/02/20 0601 04/03/20 0337 04/04/20 0655 04/04/20 1423 04/05/20 0707  NA 121*   < > 123* 124* 124* 124* 125*  K 3.3*  --  3.7 3.4* 3.7  --  3.5  CL 89*  --  91* 95* 96*  --  98  CO2 25  --  24 24 22   --  21*  GLUCOSE 97  --  98 139* 97  --  98  BUN <5*  --  <5* <5* <5*  --  5*  CREATININE 0.37*  --  0.38* 0.36* 0.36*  --  0.34*  CALCIUM 7.8*  --  7.7* 7.4* 7.5*  --  7.8*  MG  --   --  1.4* 1.8 1.7  --  1.6*   < > = values in this interval not displayed.   GFR: Estimated Creatinine Clearance: 89.1 mL/min (A) (by C-G formula based on SCr of 0.34 mg/dL (L)). Liver Function Tests: Recent Labs  Lab 03/31/20 1155 04/01/20 0731 04/02/20 0601 04/03/20 0337 04/04/20 0655  AST 111* 87* 98* 101* 82*  ALT  44 40 45* 50* 49*  ALKPHOS 118 102 109 109 97  BILITOT 1.9* 1.3* 1.1 1.1 1.4*  PROT 5.4* 5.2* 5.2* 5.2* 4.9*  ALBUMIN 2.2* 2.2* 2.1* 2.1* 2.0*   Recent Labs  Lab 03/30/20 1256  LIPASE 40   No results for input(s): AMMONIA in the last 168 hours. Coagulation Profile: Recent Labs  Lab 03/30/20 1934 03/31/20 0728 04/01/20 0731 04/05/20 0707  INR 1.1 1.1 1.1 1.1   Cardiac Enzymes: No results for input(s): CKTOTAL, CKMB, CKMBINDEX, TROPONINI in the last 168 hours. BNP (last 3 results) No results for input(s): PROBNP in the last 8760 hours. HbA1C: No results for input(s): HGBA1C in the last 72 hours. CBG: Recent Labs  Lab 04/04/20 1710  GLUCAP 135*   Lipid Profile: No results for input(s): CHOL, HDL, LDLCALC, TRIG, CHOLHDL, LDLDIRECT in the last 72 hours. Thyroid Function Tests: Recent Labs    04/04/20 0936  TSH 2.672   Anemia Panel: No results for input(s):  VITAMINB12, FOLATE, FERRITIN, TIBC, IRON, RETICCTPCT in the last 72 hours. Sepsis Labs: Recent Labs  Lab 03/30/20 1256 04/01/20 0731  LATICACIDVEN 2.1* 1.0    Recent Results (from the past 240 hour(s))  SARS Coronavirus 2 by RT PCR (hospital order, performed in [facility] hospital lab) Nasopharyngeal Nasopharyngeal Swab     Status: None   Collection Time: 03/30/20  1:37 PM   Specimen: Nasopharyngeal Swab  Result Value Ref Range Status   SARS Coronavirus 2 NEGATIVE NEGATIVE Final    Comment: (NOTE) SARS-CoV-2 target nucleic acids are NOT DETECTED.  The SARS-CoV-2 RNA is generally detectable in upper and lower respiratory specimens during the acute phase of infection. The lowest concentration of SARS-CoV-2 viral copies this assay can detect is 250 copies / mL. A negative result does not preclude SARS-CoV-2 infection and should not be used as the sole basis for treatment or other patient management decisions.  A negative result may occur with improper specimen collection / handling, submission of specimen other than nasopharyngeal swab, presence of viral mutation(s) within the areas targeted by this assay, and inadequate number of viral copies (<250 copies / mL). A negative result must be combined with clinical observations, patient history, and epidemiological information.  Fact Sheet for Patients:   BoilerBrush.com.cy  Fact Sheet for Healthcare Providers: https://pope.com/  This test is not yet approved or  cleared by the [place] FDA and has been authorized for detection and/or diagnosis of SARS-CoV-2 by FDA under an Emergency Use Authorization (EUA).  This EUA will remain in effect (meaning this test can be used) for the duration of the COVID-19 declaration under Section 564(b)(1) of the Act, 21 U.S.C. section 360bbb-3(b)(1), unless the authorization is terminated or revoked sooner.  Performed at [facility],  [address] [place], [place] [address]    Radiology Studies: DG Chest 2 View  Result Date: 04/04/2020 CLINICAL DATA:  Shortness of breath EXAM: CHEST - 2 VIEW COMPARISON:  February 18, 2017; chest CT April 03, 2014 FINDINGS: There is a small right pleural effusion. There is an area of ill-defined opacity in the medial left base. Lungs elsewhere clear. Heart size and pulmonary vascularity are normal. No adenopathy. There is degenerative change in each shoulder. IMPRESSION: Small right pleural effusion. Opacity medial left base, likely a focus of pneumonia. Lungs elsewhere clear. Heart size normal. No adenopathy evident. Electronically Signed   By: Bretta Bang III M.D.   On: 04/04/2020 12:18   US Abdomen Complete  Result Date: 04/04/2020 CLINICAL  DATA:  Elevated liver enzymes EXAM: ABDOMEN ULTRASOUND COMPLETE COMPARISON:  March 31, 2020 ultrasound right upper quadrant FINDINGS: Gallbladder: Sludge is noted within the gallbladder. No echogenic foci which move and shadow as is expected with gallstones evident currently. No gallbladder wall thickening or pericholecystic fluid. No sonographic Murphy sign noted by sonographer. Common bile duct: Diameter: 5 mm. No intrahepatic, common hepatic, or common bile duct dilatation. Liver: No focal lesion identified. Liver contour is nodular. Liver overall appears somewhat small with a somewhat coarsened overall increased echotexture pattern. Portal vein is patent on color Doppler imaging with normal direction of blood flow towards the liver. IVC: No abnormality visualized in areas which can be assessed. Most of the inferior vena cava is obscured by gas. Pancreas: Essentially completely obscured by gas. Spleen: Size and appearance within normal limits. Right Kidney: Length: 11.5 cm. Echogenicity within normal limits. No mass or hydronephrosis visualized. Left Kidney: Length: 12.5 cm. Echogenicity within normal limits. No mass or hydronephrosis visualized. There is  slight perinephric fluid. Abdominal aorta: No aneurysm visualized. Other findings: Moderate ascites. IMPRESSION: 1. The appearance of the liver is indicative of hepatic cirrhosis. No focal liver lesions evident. It should be noted that the sensitivity of ultrasound for detection of focal liver lesions is diminished significantly in this circumstance. 2.  Moderate ascites. 3. Sludge in gallbladder. No gallstones evident. No gallbladder wall thickening. 4. Pancreas obscured by gas. Most of inferior vena cava obscured by gas. 5. Slight perinephric fluid left kidney, a finding of questionable significance. This fluid potentially could be due to ascites. Electronically Signed   By: Bretta Bang III M.D.   On: 04/04/2020 12:17    Scheduled Meds: . vitamin C  500 mg Oral BID  . chlordiazePOXIDE  5 mg Oral TID  . feeding supplement (ENSURE ENLIVE)  237 mL Oral BID BM  . folic acid  1 mg Oral Daily  . furosemide  20 mg Intravenous BID  . mouth rinse  15 mL Mouth Rinse BID  . multivitamin with minerals  1 tablet Oral Daily  . nicotine  14 mg Transdermal Daily  . PARoxetine  20 mg Oral Daily  . potassium chloride  20 mEq Oral BID  . thiamine  100 mg Oral Daily   Or  . thiamine  100 mg Intravenous Daily   Continuous Infusions:   LOS: 6 days   Time spent: 30 minutes  Adler Alton Laural Benes, MD How to contact the [facility] Attending or Consulting provider 7A - 7P or covering provider during after hours 7P -7A, for this patient?  1. Check the care team in [facility] and look for a) attending/consulting TRH provider listed and b) the [facility] team listed 2. Log into [email or web address] and use [place]'s universal password to access. If you do not have the password, please contact the hospital operator. 3. Locate the [facility] provider you are looking for under [facility] and page to a number that you can be directly reached. 4. If you still have difficulty reaching the provider, please page the [facility] (Director on Call) for  the Hospitalists listed on amion for assistance.  [facility]  If 7PM-7AM, please contact night-coverage [email or web address] 04/05/2020, 3:41 PM

## 2020-04-05 NOTE — Progress Notes (Signed)
Nephrology Follow-Up Consult note   Assessment/Recommendations: Clifford Hunt is a/an 59 y.o. male with a past medical history significant for alcohol use disorder, admitted for hyponatremia now found to have cirrhosis.     Hypotonic hyponatremia: Sodium 114 on arrival.  Likely initially with poor solute intake and beer Potomania predominating and improved to 124 with saline infusion.  Now minimal improvement and urine studies unequivocal (obtained while on saline).  Likely multifactorial but now that cirrhosis has been found this is likely contributing to the hyponatremia.  Also possible that he has reset Osmo stat given his chronic alcohol use disorder.  SIADH is also possible given his history of smoking. -Obtain repeat urine studies this morning now off Lasix and saline for greater than 12 hours -Plan for ongoing diuretics given ascites and cirrhosis -Diuretic management under cirrhosis as below -Continue to limit free water intake to less than 1.8 L daily -TSH and cortisol not concerning -Chest x-ray with possible lung capacity, will consider CT scan if urine studies continue to suggest possible SIADH -Continue to monitor sodium twice daily  Alcohol use disorder: Chronic misuse of alcohol.  Counseled on cessation  LFT abnormalities/cirrhosis: AST and ALT mildly elevated related to alcohol use disorder.  INR normal.  Albumin low suggestive of some synthetic liver dysfunction.  Thrombocytopenia.  Ultrasound suggestive of cirrhosis likely related to alcohol.  Hepatitis negative. -Start Lasix 20 mg twice daily; transition to oral Lasix as able -Likely to start spironolactone tomorrow -Would benefit from GI/hepatology involvement in the outpatient setting -Counseled on alcohol cessation  Lung capacity: Consider CT scan as above  Hypomagnesemia: Replace with 2 g of IV magnesium today.  Hypokalemia: Mild at 3.5.  Potassium chloride 20 mEq twice daily while receiving  diuretics  Bicytopenia/pancytopenia: Anemia and thrombocytopenia with intermittently low white blood cell count.  Most likely related to alcohol use disorder causing bone marrow suppression and cirrhosis contributing. Vit B 12 normal. On folate.  CTM  Tobacco use disorder: Counseled on cessation   Recommendations conveyed to primary service.    Darnell Level [facility] 04/05/2020 9:35 AM  ___________________________________________________________  CC: Hyponatremia  Interval History/Subjective: Patient states he feels about the same today.  Denies significant nausea or confusion.  We discussed that he has a diagnosis of cirrhosis likely related to alcohol.  He seems to understand but has a little to comment her question about.  Given 1 dose of IV Lasix yesterday and mild improvement in sodium to 125.   Medications:  Current Facility-Administered Medications  Medication Dose Route Frequency Provider Last Rate Last Admin  . alum & mag hydroxide-simeth (MAALOX/MYLANTA) 200-200-20 MG/5ML suspension 30 mL  30 mL Oral Q4H PRN Sherryll Burger, Pratik D, DO   30 mL at 04/04/20 1523  . ascorbic acid (VITAMIN C) tablet 500 mg  500 mg Oral BID Sherryll Burger, Pratik D, DO   500 mg at 04/05/20 0851  . chlordiazePOXIDE (LIBRIUM) capsule 5 mg  5 mg Oral TID Sherryll Burger, Pratik D, DO   5 mg at 04/05/20 0850  . feeding supplement (ENSURE ENLIVE) (ENSURE ENLIVE) liquid 237 mL  237 mL Oral BID BM Levie Heritage, DO   237 mL at 04/05/20 0852  . folic acid (FOLVITE) tablet 1 mg  1 mg Oral Daily Sherryll Burger, Pratik D, DO   1 mg at 04/05/20 0851  . furosemide (LASIX) injection 20 mg  20 mg Intravenous BID Darnell Level, MD   20 mg at 04/05/20 2542  . magnesium sulfate IVPB  2 g 50 mL  2 g Intravenous Once Darnell Level, MD 50 mL/hr at 04/05/20 0850 2 g at 04/05/20 0850  . MEDLINE mouth rinse  15 mL Mouth Rinse BID Levie Heritage, DO   15 mL at 04/05/20 7026  . metoprolol tartrate (LOPRESSOR) injection 10 mg   10 mg Intravenous Q6H PRN Sherryll Burger, Pratik D, DO      . multivitamin with minerals tablet 1 tablet  1 tablet Oral Daily Sherryll Burger, Pratik D, DO   1 tablet at 04/05/20 0851  . nicotine (NICODERM CQ - dosed in mg/24 hours) patch 14 mg  14 mg Transdermal Daily Levie Heritage, DO   14 mg at 04/05/20 3785  . ondansetron (ZOFRAN) tablet 4 mg  4 mg Oral Q6H PRN Levie Heritage, DO       Or  . ondansetron [facility]) injection 4 mg  4 mg Intravenous Q6H PRN Levie Heritage, DO   4 mg at 04/04/20 1411  . PARoxetine (PAXIL) tablet 20 mg  20 mg Oral Daily Levie Heritage, DO   20 mg at 04/05/20 8850  . potassium chloride (KLOR-CON) packet 20 mEq  20 mEq Oral BID Darnell Level, MD   20 mEq at 04/05/20 0846  . thiamine tablet 100 mg  100 mg Oral Daily Levie Heritage, DO   100 mg at 04/05/20 2774   Or  . thiamine (B-1) injection 100 mg  100 mg Intravenous Daily Levie Heritage, DO   100 mg at 03/30/20 1341      Review of Systems: 10 systems reviewed and negative except per interval history/subjective  Physical Exam: Vitals:   04/05/20 0500 04/05/20 0751  BP: 114/86 122/74  Pulse: 98 92  Resp: 18   Temp: 98.1 F (36.7 C)   SpO2: 99%    No intake/output data recorded.  Intake/Output Summary (Last 24 hours) at 04/05/2020 0935 Last data filed at 04/05/2020 0500 Gross per 24 hour  Intake 960 ml  Output 1300 ml  Net -340 ml   Constitutional: Chronically ill-appearing, no distress ENMT: ears and nose without scars or lesions, MMM CV: normal rate, trace edema in the bilateral lower extremities Respiratory: No chest rise with no increased work of breathing Gastrointestinal: soft, mild distention Skin: no visible lesions or rashes Psych: alert, judgement/insight appropriate, appropriate mood and affect   Test Results I personally reviewed new and old clinical labs and radiology tests Lab Results  Component Value Date   NA 125 (L) 04/05/2020   K 3.5 04/05/2020   CL 98 04/05/2020   CO2 21 (L)  04/05/2020   BUN 5 (L) 04/05/2020   CREATININE 0.34 (L) 04/05/2020   CALCIUM 7.8 (L) 04/05/2020   ALBUMIN 2.0 (L) 04/04/2020

## 2020-04-06 LAB — BASIC METABOLIC PANEL
Anion gap: 8 (ref 5–15)
BUN: 8 mg/dL (ref 6–20)
CO2: 21 mmol/L — ABNORMAL LOW (ref 22–32)
Calcium: 7.7 mg/dL — ABNORMAL LOW (ref 8.9–10.3)
Chloride: 95 mmol/L — ABNORMAL LOW (ref 98–111)
Creatinine, Ser: 0.4 mg/dL — ABNORMAL LOW (ref 0.61–1.24)
GFR calc Af Amer: >60 mL/min (ref >60)
GFR calc non Af Amer: >60 mL/min (ref >60)
Glucose, Bld: 98 mg/dL (ref 70–99)
Potassium: 3.5 mmol/L (ref 3.5–5.1)
Sodium: 124 mmol/L — ABNORMAL LOW (ref 135–145)

## 2020-04-06 LAB — MAGNESIUM: Magnesium: 1.8 mg/dL (ref 1.7–2.4)

## 2020-04-06 MED ORDER — SPIRONOLACTONE 25 MG PO TABS
25.0000 mg | ORAL_TABLET | Freq: Every day | ORAL | Status: DC
  Administered 2020-04-06 – 2020-04-11 (×6): 25 mg via ORAL
  Filled 2020-04-06 (×6): qty 1

## 2020-04-06 MED ORDER — FUROSEMIDE 40 MG PO TABS
40.0000 mg | ORAL_TABLET | Freq: Two times a day (BID) | ORAL | Status: DC
  Administered 2020-04-06 – 2020-04-07 (×2): 40 mg via ORAL
  Filled 2020-04-06 (×2): qty 1

## 2020-04-06 NOTE — TOC Progression Note (Signed)
Transition of Care [facility]) - Progression Note    Patient Details  Name: Clifford Hunt MRN: [id number] Date of Birth: October 01, 1960  Transition of Care [facility]) CM/SW Contact  Leitha Bleak, RN Phone Number: 04/06/2020, 10:13 AM  Clinical Narrative:     Patient is not medically ready, Updated Amber at [facility] in [place]. They will accept patient over the weekend,Weekend [facility] and MD updated. Patient will need COVID test. Call report to [phone] Fax clinicals to [phone]. Patient will go to Room 2 [facility].  Weekend Supervisor is [employer]. Drinda Butts- admission coordinator [phone].

## 2020-04-06 NOTE — Care Management Important Message (Signed)
Important Message  Patient Details  Name: Clifford Hunt MRN: [id number] Date of Birth: January 15, 1961   Medicare Important Message Given:  Yes     Corey Harold 04/06/2020, 1:25 PM

## 2020-04-06 NOTE — Progress Notes (Signed)
Physical Therapy Treatment Patient Details Name: Clifford Hunt MRN: [id number] DOB: [birth year] Today's Date: 04/06/2020    History of Present Illness Clifford Hunt is a 59 y.o. male with a history of alcoholism with 5-6 beers a day with little to no oral food intake.  Patient was seen at [facility] and had blood work, which showed hyponatremia.  The patient was called by his PCPs office and instructed to come to the emergency room for evaluation.  He reports feeling weak with little to no energy.  No palliating or provoking factors.  His last alcohol consumption was earlier today: He had 2 beers prior to getting up.  He does report some nausea and lack of appetite.  He does have a longstanding history of foods not tasting right, which affects his appetite.  His symptoms are unchanged.  He does bruise easily, especially in his arms and hands.  He has no swelling in his legs.    PT Comments    Patient demonstrates slow labored movement for sitting up at bedside requiring assistance for rolling to side and sitting up from side lying position using bed rail, frequent loss of balance while completing BLE ROM/strengthening exercises seated at bedside and limited to a few unsteady labored sides with buckling of knees and frequent loss of balance.  Patient tolerated sitting up in chair after therapy with family member present in room.  Patient will benefit from continued physical therapy in hospital and recommended venue below to increase strength, balance, endurance for safe ADLs and gait.     Follow Up Recommendations  SNF;Supervision/Assistance - 24 hour     Equipment Recommendations  None recommended by PT    Recommendations for Other Services       Precautions / Restrictions Precautions Precautions: Fall Restrictions Weight Bearing Restrictions: No    Mobility  Bed Mobility Overal bed mobility: Needs Assistance Bed Mobility: Rolling;Sidelying to Sit Rolling: Min  assist Sidelying to sit: Mod assist       General bed mobility comments: slow labored movement with difficulty pulling self to sitting secondary to weakness  Transfers Overall transfer level: Needs assistance Equipment used: Rolling walker (2 wheeled) Transfers: Sit to/from [employer] Sit to Stand: Mod assist Stand pivot transfers: Mod assist       General transfer comment: very unsteady on feet with frequent buckling knees, flexed trunk  Ambulation/Gait Ambulation/Gait assistance: Mod assist;Max assist Gait Distance (Feet): 5 Feet Assistive device: Rolling walker (2 wheeled) Gait Pattern/deviations: Step-to pattern;Decreased step length - right;Decreased step length - left;Decreased stride length;Trunk flexed;Leaning posteriorly Gait velocity: decreased   General Gait Details: limited to 5-6 slow labored unsteady side steps with trunk flexed and frequent buckling of knees due to weakness   Stairs             Wheelchair Mobility    Modified Rankin (Stroke Patients Only)       Balance Overall balance assessment: Needs assistance Sitting-balance support: Feet supported;No upper extremity supported Sitting balance-Leahy Scale: Poor Sitting balance - Comments: fair/poor with BUE support   Standing balance support: During functional activity;Bilateral upper extremity supported Standing balance-Leahy Scale: Poor Standing balance comment: using RW                            Cognition Arousal/Alertness: Awake/alert Behavior During Therapy: WFL for tasks assessed/performed Overall Cognitive Status: Within Functional Limits for tasks assessed  Exercises General Exercises - Lower Extremity Long Arc Quad: Seated;AROM;Strengthening;Both;10 reps Hip Flexion/Marching: Seated;AROM;Strengthening;Both;10 reps Toe Raises: Seated;AROM;Strengthening;Both;10 reps Heel Raises:  Seated;AROM;Strengthening;Both;10 reps    General Comments        Pertinent Vitals/Pain Pain Assessment: No/denies pain    Home Living                      Prior Function            PT Goals (current goals can now be found in the care plan section) Acute Rehab PT Goals Patient Stated Goal: Go to SNF to get stronger PT Goal Formulation: With patient/family Time For Goal Achievement: 04/16/20 Potential to Achieve Goals: Good Progress towards PT goals: Progressing toward goals    Frequency    Min 3X/week      PT Plan Current plan remains appropriate    Co-evaluation              AM-PAC PT "6 Clicks" Mobility   Outcome Measure  Help needed turning from your back to your side while in a flat bed without using bedrails?: A Little Help needed moving from lying on your back to sitting on the side of a flat bed without using bedrails?: A Lot Help needed moving to and from a bed to a chair (including a wheelchair)?: A Lot Help needed standing up from a chair using your arms (e.g., wheelchair or bedside chair)?: A Lot Help needed to walk in hospital room?: A Lot Help needed climbing 3-5 steps with a railing? : Total 6 Click Score: 12    End of Session   Activity Tolerance: Patient tolerated treatment well;Patient limited by fatigue Patient left: in chair;with call bell/phone within reach;with chair alarm set;with family/visitor present Nurse Communication: Mobility status PT Visit Diagnosis: Unsteadiness on feet (R26.81);Other abnormalities of gait and mobility (R26.89);Repeated falls (R29.6);Muscle weakness (generalized) (M62.81);Difficulty in walking, not elsewhere classified (R26.2)     Time: 4665-9935 PT Time Calculation (min) (ACUTE ONLY): 25 min  Charges:  $Therapeutic Exercise: 8-22 mins $Therapeutic Activity: 8-22 mins                     2:42 PM, 04/06/20 [name], MPT Physical Therapist with [facility] [phone]  [phone] office [phone] mobile phone

## 2020-04-06 NOTE — Progress Notes (Signed)
PROGRESS NOTE    Clifford Hunt  OBS:[id number] DOB: [birth year] DOA: 03/30/2020 PCP: [facility]   Brief Narrative:  Per HPI: Clifford Hunt a 59 y.o.malewith a history of alcoholismwith 5-6 beers a day with little to no oral food intake. Patient was seen at [facility] and had blood work, which showed hyponatremia. The patient was called by his PCPs office and instructed to come to the emergency room for evaluation. He reports feeling weak with little to no energy. No palliating or provoking factors. His last alcohol consumption was earlier today: He had 2 beers prior to getting up. He does report some nausea and lack of appetite. He does have a longstanding history of foods not tasting right, which affects his appetite. His symptoms are unchanged. He does bruise easily, especially in his arms and hands. He has no swelling in his legs.  9/25:Patient was admitted with acute hyponatremia with serum sodium of 114 in the setting of beer [place] mania. This was noted on blood work as noted above. He was noted to have some nausea and change in appetite. His sodium levels have improved to 122 this morning and further IV fluid will be held and he will be maintained on fluid restriction for now with repeat labs in a.m. 2D echocardiogram ordered and pending. Potassium will be supplemented.  9/26:Patient continues to have some weakness but is more awake and alert today. His serum sodium remained stable at 121. Plan to restart IV normal saline. 2D echocardiogram pending. Right upper quadrant ultrasound with findings of liver cirrhosis and gallbladder sludge and stones noted. He appears to be tolerating diet. He will require PT evaluation and likely need placement to SNF/rehab.  9/27: Patient continues to remain weak with sodium levels of 123 noted today.  Plan to continue IV normal saline throughout the day at higher rate and recheck in  a.m.  Replete magnesium.  PT evaluation pending.  9/28: PT has assessed patient with recommendation for SNF noted.  His sodium levels are at 124 today.  Reiterated to nursing staff to obtain urine studies.  Discussed with nephrology regarding possibility for hypertonic saline and they have recommended continuing with normal saline for now until further studies return.  Urine protein/creatinine ratio will also be obtained to further analyze proteinuria.  9/29: sodium unchanged. Nephrology consultation.    9/30: sodium slightly improved.  Diuretics started by nephrology team.   Assessment & Plan:   Principal Problem:   Acute hyponatremia Active Problems:   Elevated LFTs   Alcohol abuse   Anemia, iron deficiency  Hyponatremia acute versus chronic-stable and slowly improving -Likely related tobeer potomania and poor solute intake -completed IV normal saline and now discontinued as patient is euvolemic -appreciate nephrology consultation -Follow labs.   History of alcohol abuse -Continue alcohol withdrawal protocol with Librium  -Patient has been reluctant to pursue alcohol treatment program in the past.  Transaminitis-stable -Likely secondary to alcohol abusewith noted cirrhosis on ultrasound 9/25  Proteinuria -Hepatitis and HIV studies negative -Urine protein creatinine ratio ordered per nephrology request for further evaluation  Mild hypokalemia -Repleted. Follow  Liver cirrhosis - diuretics started 9/30 by nephrology team -outpatient GI follow up recommended. -alcohol cessation advised  Anemia/thrombocytopenia-stable -Likely secondary to poor nutrition and ongoing chronic alcohol abuse -Anemia panel orderedwith no acute findings -Follow  Ongoing tobacco abuse -Nicotine patch -Counseled on cessation  Ambulatory dysfunction -SNF when ready for discharge  DVT prophylaxis:SCDs Code Status:Full Family Communication:Discussed with fianc on  phone  10/1 Disposition Plan:SNF Status is: Inpatient  Remains inpatient appropriate because:IV treatments appropriate due to intensity of illness or inability to take PO and Inpatient level of care appropriate due to severity of illness   Dispo: The patient is from:Home Anticipated d/c is to:SNF Anticipated d/c date is: 2-3days Patient currently is not medically stable to d/c.PT recommending SNF on discharge.  Nephrology team working on improving sodium levels.  Not medically ready to discharge today.   Consultants:  Discussed with Dr. Valentino Nose 9/29  Procedures:  See below  Antimicrobials:   None   Subjective: Patient eating a little better today.   Objective: Vitals:   04/05/20 1447 04/05/20 2222 04/06/20 0700 04/06/20 1343  BP: 92/74 114/79 (!) 109/94 129/78  Pulse: 100 100 (!) 101 95  Resp: 17 18 18 18   Temp: 97.9 F (36.6 C)  98.2 F (36.8 C) 98.1 F (36.7 C)  TempSrc: Oral  Oral Oral  SpO2: 94% 98% 97% 95%  Weight:      Height:        Intake/Output Summary (Last 24 hours) at 04/06/2020 1440 Last data filed at 04/06/2020 1300 Gross per 24 hour  Intake 1200 ml  Output 1300 ml  Net -100 ml   Filed Weights   03/30/20 1228  Weight: 62.6 kg    Examination:  General exam: chronically ill appearing male, Appears weak and lethargic Respiratory system: Clear to auscultation. Respiratory effort normal. Cardiovascular system: normal S1 & S2 heard.  Gastrointestinal system: Abdomen is nondistended, soft and nontender.  Central nervous system: Alert and awake Extremities: No edema Skin: No rashes, lesions or ulcers Psychiatry: Flat affect  Data Reviewed: I have personally reviewed following labs and imaging studies  CBC: Recent Labs  Lab 03/31/20 0728 04/01/20 0731 04/02/20 0601 04/03/20 0337 04/04/20 0655  WBC 4.1 3.5* 3.9* 4.1 4.0  HGB 8.9* 9.0* 9.3* 8.9* 9.2*  HCT 25.3* 25.5* 26.9* 26.1* 27.0*  MCV  110.5* 111.4* 112.6* 112.5* 113.0*  PLT 86* 85* 89* 94* 105*   Basic Metabolic Panel: Recent Labs  Lab 04/02/20 0601 04/02/20 0601 04/03/20 0337 04/03/20 0337 04/04/20 0655 04/04/20 1423 04/05/20 0707 04/05/20 1424 04/06/20 0400  NA 123*   < > 124*   < > 124* 124* 125* 126* 124*  K 3.7  --  3.4*  --  3.7  --  3.5  --  3.5  CL 91*  --  95*  --  96*  --  98  --  95*  CO2 24  --  24  --  22  --  21*  --  21*  GLUCOSE 98  --  139*  --  97  --  98  --  98  BUN <5*  --  <5*  --  <5*  --  5*  --  8  CREATININE 0.38*  --  0.36*  --  0.36*  --  0.34*  --  0.40*  CALCIUM 7.7*  --  7.4*  --  7.5*  --  7.8*  --  7.7*  MG 1.4*  --  1.8  --  1.7  --  1.6*  --  1.8   < > = values in this interval not displayed.   GFR: Estimated Creatinine Clearance: 89.1 mL/min (A) (by C-G formula based on SCr of 0.4 mg/dL (L)). Liver Function Tests: Recent Labs  Lab 03/31/20 1155 04/01/20 0731 04/02/20 0601 04/03/20 0337 04/04/20 0655  AST 111* 87* 98* 101* 82*  ALT 44  40 45* 50* 49*  ALKPHOS 118 102 109 109 97  BILITOT 1.9* 1.3* 1.1 1.1 1.4*  PROT 5.4* 5.2* 5.2* 5.2* 4.9*  ALBUMIN 2.2* 2.2* 2.1* 2.1* 2.0*   No results for input(s): LIPASE, AMYLASE in the last 168 hours. No results for input(s): AMMONIA in the last 168 hours. Coagulation Profile: Recent Labs  Lab 03/30/20 1934 03/31/20 0728 04/01/20 0731 04/05/20 0707  INR 1.1 1.1 1.1 1.1   Cardiac Enzymes: No results for input(s): CKTOTAL, CKMB, CKMBINDEX, TROPONINI in the last 168 hours. BNP (last 3 results) No results for input(s): PROBNP in the last 8760 hours. HbA1C: No results for input(s): HGBA1C in the last 72 hours. CBG: Recent Labs  Lab 04/04/20 1710  GLUCAP 135*   Lipid Profile: No results for input(s): CHOL, HDL, LDLCALC, TRIG, CHOLHDL, LDLDIRECT in the last 72 hours. Thyroid Function Tests: Recent Labs    04/04/20 0936  TSH 2.672   Anemia Panel: No results for input(s): VITAMINB12, FOLATE, FERRITIN, TIBC,  IRON, RETICCTPCT in the last 72 hours. Sepsis Labs: Recent Labs  Lab 04/01/20 0731  LATICACIDVEN 1.0    Recent Results (from the past 240 hour(s))  SARS Coronavirus 2 by RT PCR (hospital order, [facility]) Nasopharyngeal Nasopharyngeal Swab     Status: None   Collection Time: 03/30/20  1:37 PM   Specimen: Nasopharyngeal Swab  Result Value Ref Range Status   SARS Coronavirus 2 NEGATIVE NEGATIVE Final    Comment: (NOTE) SARS-CoV-2 target nucleic acids are NOT DETECTED.  The SARS-CoV-2 RNA is generally detectable in upper and lower respiratory specimens during the acute phase of infection. The lowest concentration of SARS-CoV-2 viral copies this assay can detect is 250 copies / mL. A negative result does not preclude SARS-CoV-2 infection and should not be used as the sole basis for treatment or other patient management decisions.  A negative result may occur with improper specimen collection / handling, submission of specimen other than nasopharyngeal swab, presence of viral mutation(s) within the areas targeted by this assay, and inadequate number of viral copies (<250 copies / mL). A negative result must be combined with clinical observations, patient history, and epidemiological information.  Fact Sheet for Patients:   BoilerBrush.com.cyhttps://www.fda.gov/media/136312/download  Fact Sheet for Healthcare Providers: https://pope.com/https://www.fda.gov/media/136313/download  This test is not yet approved or  cleared by the [place] FDA and has been authorized for detection and/or diagnosis of SARS-CoV-2 by FDA under an Emergency Use Authorization (EUA).  This EUA will remain in effect (meaning this test can be used) for the duration of the COVID-19 declaration under Section 564(b)(1) of the Act, 21 U.S.C. section 360bbb-3(b)(1), unless the authorization is terminated or revoked sooner.  Performed at [facility], [address] [place], [place] [address]    Radiology  Studies: No results found.  Scheduled Meds: . vitamin C  500 mg Oral BID  . chlordiazePOXIDE  5 mg Oral TID  . feeding supplement (ENSURE ENLIVE)  237 mL Oral BID BM  . folic acid  1 mg Oral Daily  . furosemide  40 mg Oral BID  . mouth rinse  15 mL Mouth Rinse BID  . multivitamin with minerals  1 tablet Oral Daily  . nicotine  14 mg Transdermal Daily  . PARoxetine  20 mg Oral Daily  . potassium chloride  20 mEq Oral BID  . spironolactone  25 mg Oral Daily  . thiamine  100 mg Oral Daily   Or  . thiamine  100  mg Intravenous Daily   Continuous Infusions:   LOS: 7 days   Time spent: 30 minutes  Elie Leppo Laural Benes, MD How to contact the [facility] Attending or Consulting provider 7A - 7P or covering provider during after hours 7P -7A, for this patient?  1. Check the care team in [facility] and look for a) attending/consulting TRH provider listed and b) the [facility] team listed 2. Log into [email or web address] and use [facility] universal password to access. If you do not have the password, please contact the hospital operator. 3. Locate the [facility] provider you are looking for under [facility] and page to a number that you can be directly reached. 4. If you still have difficulty reaching the provider, please page the [facility] (Director on Call) for the Hospitalists listed on amion for assistance.  [facility]  If 7PM-7AM, please contact night-coverage [email or web address] 04/06/2020, 2:40 PM

## 2020-04-06 NOTE — Progress Notes (Signed)
Nephrology Follow-Up Consult note   Assessment/Recommendations: Clifford Hunt is a/an 59 y.o. male with a past medical history significant for alcohol use disorder, admitted for hyponatremia now found to have cirrhosis.     Hypotonic hyponatremia: Sodium 114 on arrival now stable around 124-126 for several days. Repeat urine studies off diuretics and fluids mostly unequivical w/ Uosm of 157 and Una of 39. Solute should be replete at this point so I favor liver disease as the main contributor with reset osmostat possibly playing a role (would benefit from outside records which may suggest against reset osmostat; only have Na of 130 and 135 in 2018).   -Continue diuretics as below; likely will need to be titrated outpt -Continue to limit free water intake to less than 1.8 L daily -TSH and cortisol not concerning -Chest x-ray with possible lung capacity, could consider CT scan outpt but unlikely SIADH based on work up -Sodium daily while inpatient  Alcohol use disorder: Chronic misuse of alcohol.  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  INR normal.  Albumin low suggestive of some synthetic liver dysfunction.  Thrombocytopenia present.  Ultrasound suggestive of cirrhosis likely related to alcohol.  Hepatitis negative. -Lasix 40mg  BID oral; will need to be titrated and likely to need less with time -Start spiro 25mg  daily -Monitor fluid status closely -Would benefit from GI/hepatology involvement in the outpatient setting -Counseled on alcohol cessation  Lung capacity: Consider CT scan as above; oupt is reasonable  Hypomagnesemia: Improved with IV magnesium repletion  Hypokalemia: Mild at 3.5.  Start aldactone as above  Bicytopenia/pancytopenia: Anemia and thrombocytopenia with intermittently low white blood cell count.  Most likely related to alcohol use disorder causing bone marrow suppression and cirrhosis contributing.  Vit B 12 normal. On folate.  CTM  Tobacco use disorder: Counseled on cessation   Recommendations conveyed to primary service.     [facility] 04/06/2020 8:29 AM  ___________________________________________________________  CC: Hyponatremia  Interval History/Subjective: Patient states that he does not feel very well today.  He worked really hard with therapy yesterday and has a lot of abdominal pain.  He is unable to characterize the pain.  He states that he feels cold.   Medications:  Current Facility-Administered Medications  Medication Dose Route Frequency Provider Last Rate Last Admin   alum & mag hydroxide-simeth (MAALOX/MYLANTA) 200-200-20 MG/5ML suspension 30 mL  30 mL Oral Q4H PRN 06/06/2020, Pratik D, DO   30 mL at 04/04/20 1523   ascorbic acid (VITAMIN C) tablet 500 mg  500 mg Oral BID Sherryll Burger D, DO   500 mg at 04/05/20 2224   chlordiazePOXIDE (LIBRIUM) capsule 5 mg  5 mg Oral TID 04/07/20 D, DO   5 mg at 04/05/20 2224   feeding supplement (ENSURE ENLIVE) (ENSURE ENLIVE) liquid 237 mL  237 mL Oral BID BM 04/07/20, DO   237 mL at 04/05/20 1331   folic acid (FOLVITE) tablet 1 mg  1 mg Oral Daily Levie Heritage, Pratik D, DO   1 mg at 04/05/20 Sherryll Burger   furosemide (LASIX) tablet 40 mg  40 mg Oral BID 04/07/20, MD       MEDLINE mouth rinse  15 mL Mouth Rinse BID 6073, DO   15 mL at 04/05/20 2225   metoprolol tartrate (LOPRESSOR) injection 10 mg  10 mg Intravenous Q6H PRN 04/07/20 D, DO  multivitamin with minerals tablet 1 tablet  1 tablet Oral Daily Sherryll Burger, Pratik D, DO   1 tablet at 04/05/20 0851   nicotine (NICODERM CQ - dosed in mg/24 hours) patch 14 mg  14 mg Transdermal Daily Levie Heritage, DO   14 mg at 04/05/20 0852   ondansetron (ZOFRAN) tablet 4 mg  4 mg Oral Q6H PRN Levie Heritage, DO       Or   ondansetron [facility]) injection 4 mg  4 mg Intravenous Q6H PRN Levie Heritage, DO   4 mg at  04/04/20 1411   PARoxetine (PAXIL) tablet 20 mg  20 mg Oral Daily Levie Heritage, DO   20 mg at 04/05/20 4481   potassium chloride (KLOR-CON) packet 20 mEq  20 mEq Oral BID Darnell Level, MD   20 mEq at 04/05/20 2224   spironolactone (ALDACTONE) tablet 25 mg  25 mg Oral Daily Darnell Level, MD       thiamine tablet 100 mg  100 mg Oral Daily Levie Heritage, DO   100 mg at 04/05/20 8563   Or   thiamine (B-1) injection 100 mg  100 mg Intravenous Daily Levie Heritage, DO   100 mg at 03/30/20 1341      Review of Systems: 10 systems reviewed and negative except per interval history/subjective  Physical Exam: Vitals:   04/05/20 2222 04/06/20 0700  BP: 114/79 (!) 109/94  Pulse: 100 (!) 101  Resp: 18 18  Temp:  98.2 F (36.8 C)  SpO2: 98% 97%   No intake/output data recorded.  Intake/Output Summary (Last 24 hours) at 04/06/2020 1497 Last data filed at 04/05/2020 2300 Gross per 24 hour  Intake 1200 ml  Output 1000 ml  Net 200 ml   Constitutional: Chronically ill-appearing, no distress ENMT: ears and nose without scars or lesions, MMM CV: normal rate, trace edema in the bilateral lower extremities Respiratory: No chest rise with no increased work of breathing Gastrointestinal: soft, mild to moderate distention Skin: no visible lesions or rashes Psych: alert, judgement/insight appropriate, appropriate mood and affect   Test Results I personally reviewed new and old clinical labs and radiology tests Lab Results  Component Value Date   NA 124 (L) 04/06/2020   K 3.5 04/06/2020   CL 95 (L) 04/06/2020   CO2 21 (L) 04/06/2020   BUN 8 04/06/2020   CREATININE 0.40 (L) 04/06/2020   CALCIUM 7.7 (L) 04/06/2020   ALBUMIN 2.0 (L) 04/04/2020

## 2020-04-06 NOTE — Plan of Care (Signed)

## 2020-04-07 LAB — BASIC METABOLIC PANEL
Anion gap: 7 (ref 5–15)
BUN: 9 mg/dL (ref 6–20)
CO2: 25 mmol/L (ref 22–32)
Calcium: 7.8 mg/dL — ABNORMAL LOW (ref 8.9–10.3)
Chloride: 94 mmol/L — ABNORMAL LOW (ref 98–111)
Creatinine, Ser: 0.42 mg/dL — ABNORMAL LOW (ref 0.61–1.24)
GFR calc Af Amer: >60 mL/min (ref >60)
GFR calc non Af Amer: >60 mL/min (ref >60)
Glucose, Bld: 102 mg/dL — ABNORMAL HIGH (ref 70–99)
Potassium: 3.4 mmol/L — ABNORMAL LOW (ref 3.5–5.1)
Sodium: 126 mmol/L — ABNORMAL LOW (ref 135–145)

## 2020-04-07 LAB — MAGNESIUM: Magnesium: 1.5 mg/dL — ABNORMAL LOW (ref 1.7–2.4)

## 2020-04-07 MED ORDER — POTASSIUM CHLORIDE CRYS ER 20 MEQ PO TBCR: 30.0000 meq | EXTENDED_RELEASE_TABLET | Freq: Two times a day (BID) | ORAL | Status: DC

## 2020-04-07 MED ORDER — POTASSIUM CHLORIDE CRYS ER 20 MEQ PO TBCR
40.0000 meq | EXTENDED_RELEASE_TABLET | Freq: Once | ORAL | Status: AC
  Administered 2020-04-07: 40 meq via ORAL
  Filled 2020-04-07: qty 2

## 2020-04-07 MED ORDER — MAGNESIUM SULFATE 4 GM/100ML IV SOLN
4.0000 g | Freq: Once | INTRAVENOUS | Status: AC
  Administered 2020-04-07: 4 g via INTRAVENOUS
  Filled 2020-04-07: qty 100

## 2020-04-07 MED ORDER — FUROSEMIDE 40 MG PO TABS
40.0000 mg | ORAL_TABLET | Freq: Every day | ORAL | Status: DC
  Administered 2020-04-08 – 2020-04-11 (×4): 40 mg via ORAL
  Filled 2020-04-07 (×4): qty 1

## 2020-04-07 NOTE — Progress Notes (Signed)
Moved patient to chair. 2 person assist with walker. Patient is very weak but was able to pivot from bed to chair.

## 2020-04-07 NOTE — Progress Notes (Signed)
Nephrology Follow-Up Consult note   Assessment/Recommendations: Clifford Hunt is a/an 59 y.o. male with a past medical history significant for alcohol use disorder, admitted for hyponatremia now found to have cirrhosis.     Hypotonic hyponatremia: Sodium 114 on arrival now stable around 124-126 for several days. Repeat urine studies off diuretics and fluids w/ Uosm of 157 and Una of 39. Liver disease favored as main contributor.  Prior labs with Na of 130 and 135 in 2018).  Note he is also on paxil.  TSH and cortisol not concerning - stopped paxil/SSRI and would not resume  - Reduce lasix to 40 mg daily (from BID) - RN is holding this afternoon's dose - Can continue spironolactone 25 mg daily -Continue to limit free water intake to less than 1.8 L daily -Chest x-ray with possible lung opacity; could consider CT scan outpt  Alcohol use disorder: Chronic misuse of alcohol.  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  INR normal.  Albumin low suggestive of some synthetic liver dysfunction.  Thrombocytopenia present.  Ultrasound suggestive of cirrhosis likely related to alcohol.  Hepatitis negative. - spironolactone and lasix as above -Would benefit from GI/hepatology involvement in the outpatient setting -Counseled on alcohol cessation  Lung opacity: Consider CT scan as above; outpt is reasonable  Depression - stopped paxil and would not resume; regimen per primary team   Hypomagnesemia: repleted again earlier today  Hypokalemia: has been started on aldactone and has been repleted today   Bicytopenia/pancytopenia: Anemia and thrombocytopenia with intermittently low white blood cell count.  Most likely related to alcohol use disorder causing bone marrow suppression and cirrhosis contributing. Vit B 12 normal. On folate.  CTM  Tobacco use disorder: Counseled on cessation  Disposition:  Would continue inpatient  monitoring  ___________________________________________________________  CC: Hyponatremia  Subjective: he had 2.4 liters UOP over 10/1 charted.  He has had some trouble with withdrawal when paxil was stopped before; he has a hx of depression.  Review of systems denies shortness of breath or chest pain Denies nausea or vomiting     Medications:  Current Facility-Administered Medications  Medication Dose Route Frequency Provider Last Rate Last Admin   alum & mag hydroxide-simeth (MAALOX/MYLANTA) 200-200-20 MG/5ML suspension 30 mL  30 mL Oral Q4H PRN Sherryll Burger, Pratik D, DO   30 mL at 04/04/20 1523   ascorbic acid (VITAMIN C) tablet 500 mg  500 mg Oral BID Sherryll Burger, Pratik D, DO   500 mg at 04/07/20 1100   chlordiazePOXIDE (LIBRIUM) capsule 5 mg  5 mg Oral TID Maurilio Lovely D, DO   5 mg at 04/07/20 1059   feeding supplement (ENSURE ENLIVE) (ENSURE ENLIVE) liquid 237 mL  237 mL Oral BID BM Levie Heritage, DO   237 mL at 04/07/20 1103   folic acid (FOLVITE) tablet 1 mg  1 mg Oral Daily Sherryll Burger, Pratik D, DO   1 mg at 04/07/20 1059   furosemide (LASIX) tablet 40 mg  40 mg Oral BID Darnell Level, MD   40 mg at 04/07/20 0542   MEDLINE mouth rinse  15 mL Mouth Rinse BID Levie Heritage, DO   15 mL at 04/07/20 1105   metoprolol tartrate (LOPRESSOR) injection 10 mg  10 mg Intravenous Q6H PRN Sherryll Burger, Pratik D, DO       multivitamin with minerals tablet 1 tablet  1 tablet Oral Daily Sherryll Burger, Pratik D, DO   1 tablet at 04/07/20  1103   nicotine (NICODERM CQ - dosed in mg/24 hours) patch 14 mg  14 mg Transdermal Daily Levie Heritage, DO   14 mg at 04/07/20 1058   ondansetron (ZOFRAN) tablet 4 mg  4 mg Oral Q6H PRN Levie Heritage, DO       Or   ondansetron [facility]) injection 4 mg  4 mg Intravenous Q6H PRN Levie Heritage, DO   4 mg at 04/04/20 1411   PARoxetine (PAXIL) tablet 20 mg  20 mg Oral Daily Levie Heritage, DO   20 mg at 04/07/20 1102   spironolactone (ALDACTONE) tablet 25 mg  25 mg  Oral Daily Darnell Level, MD   25 mg at 04/07/20 1101   thiamine tablet 100 mg  100 mg Oral Daily Levie Heritage, DO   100 mg at 04/07/20 1101   Or   thiamine (B-1) injection 100 mg  100 mg Intravenous Daily Levie Heritage, DO   100 mg at 03/30/20 1341     Physical Exam: Vitals:   04/07/20 0415 04/07/20 1428  BP: 116/87 104/83  Pulse: 98 89  Resp: 18 18  Temp: 99 F (37.2 C) 98.9 F (37.2 C)  SpO2: 92% 94%   Total I/O In: 480 [P.O.:480] Out: -   Intake/Output Summary (Last 24 hours) at 04/07/2020 1612 Last data filed at 04/07/2020 1500 Gross per 24 hour  Intake 720 ml  Output 1225 ml  Net -505 ml   Constitutional: Chronically ill-appearing, no distress  ENMT: ears and nose without scars or lesions, MMM CV: normal rate, trace edema in the bilateral lower extremities Respiratory:  Clear and unlabored on room air Gastrointestinal: soft, mild distention; nontender to palpation Skin: no visible lesions or rashes Psych: alert, judgement/insight appropriate, appropriate mood and affect Neuro - awake and alert provides hx and follows commands   Test Results I personally reviewed new and old clinical labs and radiology tests Lab Results  Component Value Date   NA 126 (L) 04/07/2020   K 3.4 (L) 04/07/2020   CL 94 (L) 04/07/2020   CO2 25 04/07/2020   BUN 9 04/07/2020   CREATININE 0.42 (L) 04/07/2020   CALCIUM 7.8 (L) 04/07/2020   ALBUMIN 2.0 (L) 04/04/2020     Estanislado Emms, MD 04/07/2020  4:34 PM

## 2020-04-07 NOTE — Progress Notes (Signed)
PROGRESS NOTE    Clifford Hunt  SWN:[id number] DOB: 05/28/1961 DOA: 03/30/2020 PCP: [name]   Brief Narrative:  Per HPI: Clifford Phillipsis a 59 y.o.malewith a history of alcoholismwith 5-6 beers a day with little to no oral food intake. Patient was seen at [facility] and had blood work, which showed hyponatremia. The patient was called by his PCPs office and instructed to come to the emergency room for evaluation. He reports feeling weak with little to no energy. No palliating or provoking factors. His last alcohol consumption was earlier today: He had 2 beers prior to getting up. He does report some nausea and lack of appetite. He does have a longstanding history of foods not tasting right, which affects his appetite. His symptoms are unchanged. He does bruise easily, especially in his arms and hands. He has no swelling in his legs.  9/25:Patient was admitted with acute hyponatremia with serum sodium of 114 in the setting of beer [place] mania. This was noted on blood work as noted above. He was noted to have some nausea and change in appetite. His sodium levels have improved to 122 this morning and further IV fluid will be held and he will be maintained on fluid restriction for now with repeat labs in a.m. 2D echocardiogram ordered and pending. Potassium will be supplemented.  9/26:Patient continues to have some weakness but is more awake and alert today. His serum sodium remained stable at 121. Plan to restart IV normal saline. 2D echocardiogram pending. Right upper quadrant ultrasound with findings of liver cirrhosis and gallbladder sludge and stones noted. He appears to be tolerating diet. He will require PT evaluation and likely need placement to SNF/rehab.  9/27: Patient continues to remain weak with sodium levels of 123 noted today.  Plan to continue IV normal saline throughout the day at higher rate and recheck in  a.m.  Replete magnesium.  PT evaluation pending.  9/28: PT has assessed patient with recommendation for SNF noted.  His sodium levels are at 124 today.  Reiterated to nursing staff to obtain urine studies.  Discussed with nephrology regarding possibility for hypertonic saline and they have recommended continuing with normal saline for now until further studies return.  Urine protein/creatinine ratio will also be obtained to further analyze proteinuria.  9/29: sodium unchanged. Nephrology consultation.    9/30: sodium slightly improved.  Diuretics started by nephrology team.  10/2: sodium 126.    Assessment & Plan:   Principal Problem:   Acute hyponatremia Active Problems:   Elevated LFTs   Alcohol abuse   Anemia, iron deficiency  Hyponatremia acute versus chronic-stable and slowly improving -Likely related tobeer potomania and poor solute intake -completed IV normal saline and now discontinued as patient is euvolemic -appreciate nephrology consultation -sodium slowly improving with diuresis  History of alcohol abuse -Continue alcohol withdrawal protocol with Librium  -Patient has been reluctant to pursue alcohol treatment program in the past.  Transaminitis-stable -Likely secondary to alcohol abusewith noted cirrhosis on ultrasound 9/25  Proteinuria -Hepatitis and HIV studies negative -Urine protein creatinine ratio ordered per nephrology request for further evaluation  Mild hypokalemia -Repleted. Follow  Liver cirrhosis - diuretics started 9/30 by nephrology team -outpatient GI follow up recommended. -alcohol cessation advised  Anemia/thrombocytopenia-stable -Likely secondary to poor nutrition and ongoing chronic alcohol abuse -Anemia panel orderedwith no acute findings -Follow  Ongoing tobacco abuse -Nicotine patch -Counseled on cessation  Ambulatory dysfunction -SNF when ready for discharge  DVT prophylaxis:SCDs  Code Status:Full Family  Communication:Discussed with fianc on phone 10/1 Disposition Plan:SNF Status is: Inpatient  Remains inpatient appropriate because:IV treatments appropriate due to intensity of illness or inability to take PO and Inpatient level of care appropriate due to severity of illness  Dispo: The patient is from:Home Anticipated d/c is to:SNF Anticipated d/c date is: 1-2days Patient currently is not medically stable to d/c.PT recommending SNF on discharge.  Nephrology team working on improving sodium levels.     Consultants:  Discussed with Dr. Valentino NosePeeples 9/29  Procedures:  See below  Antimicrobials:   None   Subjective: Patient wants to get out of the hospital.    Objective: Vitals:   04/06/20 0700 04/06/20 1343 04/06/20 2215 04/07/20 0415  BP: (!) 109/94 129/78 (!) 138/99 116/87  Pulse: (!) 101 95 99 98  Resp: 18 18 18 18   Temp: 98.2 F (36.8 C) 98.1 F (36.7 C) 99.5 F (37.5 C) 99 F (37.2 C)  TempSrc: Oral Oral Oral Oral  SpO2: 97% 95% 97% 92%  Weight:      Height:        Intake/Output Summary (Last 24 hours) at 04/07/2020 1212 Last data filed at 04/07/2020 0606 Gross per 24 hour  Intake 598 ml  Output 2425 ml  Net -1827 ml   Filed Weights   03/30/20 1228  Weight: 62.6 kg    Examination:  General exam: chronically ill appearing male, Appears weak and lethargic Respiratory system: Clear to auscultation. Respiratory effort normal. Cardiovascular system: normal S1 & S2 heard.  Gastrointestinal system: Abdomen is nondistended, soft and nontender.  Central nervous system: Alert and awake Extremities: No edema Skin: No rashes, lesions or ulcers Psychiatry: Flat affect  Data Reviewed: I have personally reviewed following labs and imaging studies  CBC: Recent Labs  Lab 04/01/20 0731 04/02/20 0601 04/03/20 0337 04/04/20 0655  WBC 3.5* 3.9* 4.1 4.0  HGB 9.0* 9.3* 8.9* 9.2*  HCT 25.5* 26.9* 26.1* 27.0*  MCV  111.4* 112.6* 112.5* 113.0*  PLT 85* 89* 94* 105*   Basic Metabolic Panel: Recent Labs  Lab 04/03/20 0337 04/03/20 0337 04/04/20 0655 04/04/20 0655 04/04/20 1423 04/05/20 0707 04/05/20 1424 04/06/20 0400 04/07/20 0655  NA 124*   < > 124*   < > 124* 125* 126* 124* 126*  K 3.4*  --  3.7  --   --  3.5  --  3.5 3.4*  CL 95*  --  96*  --   --  98  --  95* 94*  CO2 24  --  22  --   --  21*  --  21* 25  GLUCOSE 139*  --  97  --   --  98  --  98 102*  BUN <5*  --  <5*  --   --  5*  --  8 9  CREATININE 0.36*  --  0.36*  --   --  0.34*  --  0.40* 0.42*  CALCIUM 7.4*  --  7.5*  --   --  7.8*  --  7.7* 7.8*  MG 1.8  --  1.7  --   --  1.6*  --  1.8 1.5*   < > = values in this interval not displayed.   GFR: Estimated Creatinine Clearance: 89.1 mL/min (A) (by C-G formula based on SCr of 0.42 mg/dL (L)). Liver Function Tests: Recent Labs  Lab 04/01/20 0731 04/02/20 0601 04/03/20 0337 04/04/20 0655  AST 87* 98* 101* 82*  ALT 40 45* 50*  49*  ALKPHOS 102 109 109 97  BILITOT 1.3* 1.1 1.1 1.4*  PROT 5.2* 5.2* 5.2* 4.9*  ALBUMIN 2.2* 2.1* 2.1* 2.0*   No results for input(s): LIPASE, AMYLASE in the last 168 hours. No results for input(s): AMMONIA in the last 168 hours. Coagulation Profile: Recent Labs  Lab 04/01/20 0731 04/05/20 0707  INR 1.1 1.1   Cardiac Enzymes: No results for input(s): CKTOTAL, CKMB, CKMBINDEX, TROPONINI in the last 168 hours. BNP (last 3 results) No results for input(s): PROBNP in the last 8760 hours. HbA1C: No results for input(s): HGBA1C in the last 72 hours. CBG: Recent Labs  Lab 04/04/20 1710  GLUCAP 135*   Lipid Profile: No results for input(s): CHOL, HDL, LDLCALC, TRIG, CHOLHDL, LDLDIRECT in the last 72 hours. Thyroid Function Tests: No results for input(s): TSH, T4TOTAL, FREET4, T3FREE, THYROIDAB in the last 72 hours. Anemia Panel: No results for input(s): VITAMINB12, FOLATE, FERRITIN, TIBC, IRON, RETICCTPCT in the last 72 hours. Sepsis  Labs: Recent Labs  Lab 04/01/20 0731  LATICACIDVEN 1.0    Recent Results (from the past 240 hour(s))  SARS Coronavirus 2 by RT PCR (hospital order, performed in [facility] hospital lab) Nasopharyngeal Nasopharyngeal Swab     Status: None   Collection Time: 03/30/20  1:37 PM   Specimen: Nasopharyngeal Swab  Result Value Ref Range Status   SARS Coronavirus 2 NEGATIVE NEGATIVE Final    Comment: (NOTE) SARS-CoV-2 target nucleic acids are NOT DETECTED.  The SARS-CoV-2 RNA is generally detectable in upper and lower respiratory specimens during the acute phase of infection. The lowest concentration of SARS-CoV-2 viral copies this assay can detect is 250 copies / mL. A negative result does not preclude SARS-CoV-2 infection and should not be used as the sole basis for treatment or other patient management decisions.  A negative result may occur with improper specimen collection / handling, submission of specimen other than nasopharyngeal swab, presence of viral mutation(s) within the areas targeted by this assay, and inadequate number of viral copies (<250 copies / mL). A negative result must be combined with clinical observations, patient history, and epidemiological information.  Fact Sheet for Patients:   BoilerBrush.com.cy  Fact Sheet for Healthcare Providers: https://pope.com/  This test is not yet approved or  cleared by the [place] FDA and has been authorized for detection and/or diagnosis of SARS-CoV-2 by FDA under an Emergency Use Authorization (EUA).  This EUA will remain in effect (meaning this test can be used) for the duration of the COVID-19 declaration under Section 564(b)(1) of the Act, 21 U.S.C. section 360bbb-3(b)(1), unless the authorization is terminated or revoked sooner.  Performed at [facility], [address], [place], [place] [address]    Radiology Studies: No results found.  Scheduled Meds: .  vitamin C  500 mg Oral BID  . chlordiazePOXIDE  5 mg Oral TID  . feeding supplement (ENSURE ENLIVE)  237 mL Oral BID BM  . folic acid  1 mg Oral Daily  . furosemide  40 mg Oral BID  . mouth rinse  15 mL Mouth Rinse BID  . multivitamin with minerals  1 tablet Oral Daily  . nicotine  14 mg Transdermal Daily  . PARoxetine  20 mg Oral Daily  . potassium chloride  40 mEq Oral Once  . spironolactone  25 mg Oral Daily  . thiamine  100 mg Oral Daily   Or  . thiamine  100 mg Intravenous Daily   Continuous Infusions: . magnesium sulfate bolus  IVPB      LOS: 8 days   Time spent: 30 minutes  Shabria Egley Laural Benes, MD How to contact the [facility] Attending or Consulting provider 7A - 7P or covering provider during after hours 7P -7A, for this patient?  1. Check the care team in [facility] and look for a) attending/consulting TR[facility] provider listed and b) the [facility] team listed 2. Log into [email or web address] and use [place]'s universal password to access. If you do not have the password, please contact the hospital operator. 3. Locate the [facility] provider you are looking for under [facility] and page to a number that you can be directly reached. 4. If you still have difficulty reaching the provider, please page the [facility] (Director on Call) for the Hospitalists listed on amion for assistance.  [facility]  If 7PM-7AM, please contact night-coverage [email or web address] 04/07/2020, 12:12 PM

## 2020-04-08 ENCOUNTER — Inpatient Hospital Stay (HOSPITAL_COMMUNITY): Payer: Medicare PPO

## 2020-04-08 LAB — BASIC METABOLIC PANEL
Anion gap: 7 (ref 5–15)
BUN: 8 mg/dL (ref 6–20)
CO2: 26 mmol/L (ref 22–32)
Calcium: 7.8 mg/dL — ABNORMAL LOW (ref 8.9–10.3)
Chloride: 91 mmol/L — ABNORMAL LOW (ref 98–111)
Creatinine, Ser: 0.4 mg/dL — ABNORMAL LOW (ref 0.61–1.24)
GFR calc Af Amer: >60 mL/min (ref >60)
GFR calc non Af Amer: >60 mL/min (ref >60)
Glucose, Bld: 129 mg/dL — ABNORMAL HIGH (ref 70–99)
Potassium: 3.2 mmol/L — ABNORMAL LOW (ref 3.5–5.1)
Sodium: 124 mmol/L — ABNORMAL LOW (ref 135–145)

## 2020-04-08 LAB — MAGNESIUM: Magnesium: 1.9 mg/dL (ref 1.7–2.4)

## 2020-04-08 MED ORDER — ALBUMIN HUMAN 25 % IV SOLN
25.0000 g | Freq: Once | INTRAVENOUS | Status: AC
  Administered 2020-04-08: 25 g via INTRAVENOUS
  Filled 2020-04-08: qty 100

## 2020-04-08 MED ORDER — FUROSEMIDE 10 MG/ML IJ SOLN
40.0000 mg | Freq: Once | INTRAMUSCULAR | Status: AC
  Administered 2020-04-08: 40 mg via INTRAVENOUS
  Filled 2020-04-08: qty 4

## 2020-04-08 MED ORDER — POTASSIUM CHLORIDE CRYS ER 20 MEQ PO TBCR
40.0000 meq | EXTENDED_RELEASE_TABLET | Freq: Once | ORAL | Status: AC
  Administered 2020-04-08: 40 meq via ORAL
  Filled 2020-04-08: qty 2

## 2020-04-08 MED ORDER — DIPHENHYDRAMINE HCL 25 MG PO CAPS
25.0000 mg | ORAL_CAPSULE | Freq: Four times a day (QID) | ORAL | Status: DC | PRN
  Administered 2020-04-08: 25 mg via ORAL
  Filled 2020-04-08: qty 1

## 2020-04-08 MED ORDER — ACETAMINOPHEN 325 MG PO TABS
650.0000 mg | ORAL_TABLET | Freq: Four times a day (QID) | ORAL | Status: DC | PRN
  Administered 2020-04-08 – 2020-04-11 (×4): 650 mg via ORAL
  Filled 2020-04-08 (×4): qty 2

## 2020-04-08 NOTE — Progress Notes (Signed)
Nephrology Follow-Up Consult note   Assessment/Recommendations: Clifford Hunt is a/an 59 y.o. male with a past medical history significant for alcohol use disorder, admitted for hyponatremia now found to have cirrhosis.     Hypotonic hyponatremia: Sodium 114 on arrival now stable around 124-126 for several days. Repeat urine studies off diuretics and fluids per charting w/ Uosm of 157 and Una of 39. Liver disease favored as main contributor.  (Prior labs with Na of 130 and 135 in 2018).  Note he had also been on paxil.  TSH and cortisol not concerning - stopped paxil/SSRI after 10/2 dose and would not resume  - lasix 40 mg IV once now with albumin 25gram (25%) then resume lasix 40 mg daily on 10/4 - Can continue spironolactone 25 mg daily  - Continue to limit free water intake to less than 1.8 L daily - Check daily weights (ordered to start) - earlier Chest x-ray with possible lung opacity; would consider CT scan outpt  Alcohol use disorder: Chronic misuse of alcohol.  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  INR normal.  Albumin low suggestive of some synthetic liver dysfunction.  Thrombocytopenia present.  Ultrasound suggestive of cirrhosis likely related to alcohol.  Hepatitis negative. - spironolactone and lasix as above -Would benefit from GI/hepatology involvement in the outpatient setting -Counseled on alcohol cessation  Lung opacity: Consider CT scan as above; outpt is reasonable  Depression - stopped paxil and would not resume; alternate regimen per primary team   Hypomagnesemia: repleted   Hypokalemia: on aldactone and has been repleted today as well   Bicytopenia/pancytopenia: Anemia and thrombocytopenia with intermittently low white blood cell count.  Most likely related to alcohol use disorder causing bone marrow suppression and cirrhosis contributing. Vit B 12 normal. On folate.    Tobacco use disorder:  Counseled on cessation  Disposition:  Would continue inpatient monitoring  ___________________________________________________________  CC: Hyponatremia  Subjective: he had 2.4 liters UOP over 10/2 charted.  Spoke with patient and his significant other at bedside.  Feels ok today.  Congratulated him on his etoh and tobacco cessation here in the hospital.  Review of systems   denies shortness of breath or chest pain however his significant other states that he is short of breath with exertion Denies nausea or vomiting     Medications:  Current Facility-Administered Medications  Medication Dose Route Frequency Provider Last Rate Last Admin  . acetaminophen (TYLENOL) tablet 650 mg  650 mg Oral Q6H PRN Zierle-Ghosh, Asia B, DO   650 mg at 04/08/20 0249  . alum & mag hydroxide-simeth (MAALOX/MYLANTA) 200-200-20 MG/5ML suspension 30 mL  30 mL Oral Q4H PRN Sherryll Burger, Pratik D, DO   30 mL at 04/04/20 1523  . ascorbic acid (VITAMIN C) tablet 500 mg  500 mg Oral BID Sherryll Burger, Pratik D, DO   500 mg at 04/08/20 0943  . chlordiazePOXIDE (LIBRIUM) capsule 5 mg  5 mg Oral TID Sherryll Burger, Pratik D, DO   5 mg at 04/08/20 0944  . feeding supplement (ENSURE ENLIVE) (ENSURE ENLIVE) liquid 237 mL  237 mL Oral BID BM Levie Heritage, DO   237 mL at 04/08/20 0944  . folic acid (FOLVITE) tablet 1 mg  1 mg Oral Daily Sherryll Burger, Pratik D, DO   1 mg at 04/08/20 0943  . furosemide (LASIX) tablet 40 mg  40 mg Oral Daily Estanislado Emms, MD   40 mg at 04/08/20 0943  .  MEDLINE mouth rinse  15 mL Mouth Rinse BID Levie Heritage, DO   15 mL at 04/08/20 0943  . metoprolol tartrate (LOPRESSOR) injection 10 mg  10 mg Intravenous Q6H PRN Sherryll Burger, Pratik D, DO      . multivitamin with minerals tablet 1 tablet  1 tablet Oral Daily Sherryll Burger, Pratik D, DO   1 tablet at 04/08/20 0943  . nicotine (NICODERM CQ - dosed in mg/24 hours) patch 14 mg  14 mg Transdermal Daily Levie Heritage, DO   14 mg at 04/08/20 0944  . ondansetron (ZOFRAN) tablet 4 mg  4  mg Oral Q6H PRN Levie Heritage, DO       Or  . ondansetron [facility]) injection 4 mg  4 mg Intravenous Q6H PRN Levie Heritage, DO   4 mg at 04/04/20 1411  . spironolactone (ALDACTONE) tablet 25 mg  25 mg Oral Daily Darnell Level, MD   25 mg at 04/08/20 0943  . thiamine tablet 100 mg  100 mg Oral Daily Levie Heritage, DO   100 mg at 04/08/20 3614   Or  . thiamine (B-1) injection 100 mg  100 mg Intravenous Daily Levie Heritage, DO   100 mg at 03/30/20 1341     Physical Exam: Vitals:   04/07/20 2116 04/08/20 0515  BP: 130/90 105/75  Pulse: 99 (!) 104  Resp: 18 18  Temp: 98.7 F (37.1 C) 98 F (36.7 C)  SpO2: 98% 91%   Total I/O In: 240 [P.O.:240] Out: -   Intake/Output Summary (Last 24 hours) at 04/08/2020 1510 Last data filed at 04/08/2020 1300 Gross per 24 hour  Intake 480 ml  Output 2400 ml  Net -1920 ml   Constitutional: Chronically ill-appearing, no distress   ENMT: ears and nose without scars or lesions, MMM CV: normal rate, trace edema in the bilateral lower extremities Respiratory:  Clear and unlabored on room air Gastrointestinal: soft, mild distention; nontender to palpation Skin: no visible lesions or rashes Psych: alert, judgement/insight appropriate, appropriate mood and affect Neuro - awake and alert provides hx and follows commands   Lab Results  Component Value Date   NA 124 (L) 04/08/2020   K 3.2 (L) 04/08/2020   CL 91 (L) 04/08/2020   CO2 26 04/08/2020   BUN 8 04/08/2020   CREATININE 0.40 (L) 04/08/2020   CALCIUM 7.8 (L) 04/08/2020   ALBUMIN 2.0 (L) 04/04/2020     Estanislado Emms, MD 04/08/2020  3:30 PM

## 2020-04-08 NOTE — Progress Notes (Signed)
Patient complaining of wheals on arms and trunks.Upon exam welts and wheals noted on trunk, legs, arms, back and groin. Albumin running via IV per order was stopped and provider notified. Orders received.  Maurilio Lovely RN 04/08/2020 @ 1800

## 2020-04-08 NOTE — Progress Notes (Signed)
PROGRESS NOTE    Clifford Hunt  ZOX:[id number] DOB: 07/11/1960 DOA: 03/30/2020 [facility]   Brief Narrative:  Per HPI: Clifford Hunt a 59 y.o.malewith a history of alcoholismwith 5-6 beers a day with little to no oral food intake. [facility] and had blood work, which showed hyponatremia. The patient was called by his PCPs office and instructed to come to the emergency room for evaluation. He reports feeling weak with little to no energy. No palliating or provoking factors. His last alcohol consumption was earlier today: He had 2 beers prior to getting up. He does report some nausea and lack of appetite. He does have a longstanding history of foods not tasting right, which affects his appetite. His symptoms are unchanged. He does bruise easily, especially in his arms and hands. He has no swelling in his legs.  9/25:Patient was admitted with acute hyponatremia with serum sodium of 114 in the setting of beer Poto mania. This was noted on blood work as noted above. He was noted to have some nausea and change in appetite. His sodium levels have improved to 122 this morning and further IV fluid will be held and he will be maintained on fluid restriction for now with repeat labs in a.m. 2D echocardiogram ordered and pending. Potassium will be supplemented.  9/26:Patient continues to have some weakness but is more awake and alert today. His serum sodium remained stable at 121. Plan to restart IV normal saline. 2D echocardiogram pending. Right upper quadrant ultrasound with findings of liver cirrhosis and gallbladder sludge and stones noted. He appears to be tolerating diet. He will require PT evaluation and likely need placement to SNF/rehab.  9/27: Patient continues to remain weak with sodium levels of 123 noted today.  Plan to continue IV normal saline throughout the day at higher rate and recheck in  a.m.  Replete magnesium.  PT evaluation pending.  9/28: PT has assessed patient with recommendation for SNF noted.  His sodium levels are at 124 today.  Reiterated to nursing staff to obtain urine studies.  Discussed with nephrology regarding possibility for hypertonic saline and they have recommended continuing with normal saline for now until further studies return.  Urine protein/creatinine ratio will also be obtained to further analyze proteinuria.  9/29: sodium unchanged. Nephrology consultation.    9/30: sodium slightly improved.  Diuretics started by nephrology team.  10/2: sodium 126.    Assessment & Plan:   Principal Problem:   Acute hyponatremia Active Problems:   Elevated LFTs   Alcohol abuse   Anemia, iron deficiency  Hyponatremia acute versus chronic-stable and slowly improving -Likely related tobeer potomania and poor solute intake -completed IV normal saline and now discontinued as patient is euvolemic -appreciate nephrology consultation -sodium holding stable at 124-126 range.   History of alcohol abuse -Continue alcohol withdrawal protocol with Librium  -Patient has been reluctant to pursue alcohol treatment program in the past.  Transaminitis-stable -Likely secondary to alcohol abusewith noted cirrhosis on ultrasound 9/25  Abnormal CXR -opacity seen possibly focus of pneumonia, repeat cXR today now that he has diuresed better  Proteinuria -Hepatitis and HIV studies negative -Urine protein creatinine ratio ordered per nephrology request for further evaluation  Mild hypokalemia -Repleted. Follow  Liver cirrhosis - diuretics started 9/30 by nephrology team -outpatient GI follow up recommended. -alcohol cessation advised  Anemia/thrombocytopenia-stable -Likely secondary to poor nutrition and ongoing chronic alcohol abuse -Anemia panel orderedwith no acute findings -Follow  Ongoing tobacco abuse -Nicotine patch -Counseled on  cessation  Ambulatory dysfunction -SNF when ready for discharge  DVT prophylaxis:SCDs Code Status:Full Family Communication:Discussed with fianc on phone 10/1 Disposition Plan:SNF Status is: Inpatient  Remains inpatient appropriate because:IV treatments appropriate due to intensity of illness or inability to take PO and Inpatient level of care appropriate due to severity of illness  Dispo: The patient is from:Home Anticipated d/c is to:SNF Anticipated d/c date is: 1-2days pending nephrology team Patient currently is not medically stable to d/c.PT recommending SNF on discharge.  Nephrology team working on improving sodium levels.     Consultants:  Discussed with Dr. Valentino Nose 9/29  Procedures:  See below  Antimicrobials:   None   Subjective: Patient reports that he enjoyed breakfast today. No complaints.   Objective: Vitals:   04/07/20 0415 04/07/20 1428 04/07/20 2116 04/08/20 0515  BP: 116/87 104/83 130/90 105/75  Pulse: 98 89 99 (!) 104  Resp: 18 18 18 18   Temp: 99 F (37.2 C) 98.9 F (37.2 C) 98.7 F (37.1 C) 98 F (36.7 C)  TempSrc: Oral     SpO2: 92% 94% 98% 91%  Weight:      Height:        Intake/Output Summary (Last 24 hours) at 04/08/2020 1252 Last data filed at 04/08/2020 0900 Gross per 24 hour  Intake 600 ml  Output 2400 ml  Net -1800 ml   Filed Weights   03/30/20 1228  Weight: 62.6 kg    Examination:  General exam: chronically ill appearing male, Appears weak but is awake and conversant.  Respiratory system: shallow BS bilateral. Respiratory effort normal. Cardiovascular system: normal S1 & S2 heard.  Gastrointestinal system: Abdomen is nondistended, soft and nontender.  Central nervous system: Alert and awake Extremities: No edema Skin: No rashes, lesions or ulcers Psychiatry: Flat affect  Data Reviewed: I have personally reviewed following labs and imaging  studies  CBC: Recent Labs  Lab 04/02/20 0601 04/03/20 0337 04/04/20 0655  WBC 3.9* 4.1 4.0  HGB 9.3* 8.9* 9.2*  HCT 26.9* 26.1* 27.0*  MCV 112.6* 112.5* 113.0*  PLT 89* 94* 105*   Basic Metabolic Panel: Recent Labs  Lab 04/04/20 0655 04/04/20 1423 04/05/20 0707 04/05/20 1424 04/06/20 0400 04/07/20 0655 04/08/20 0604  NA 124*   < > 125* 126* 124* 126* 124*  K 3.7  --  3.5  --  3.5 3.4* 3.2*  CL 96*  --  98  --  95* 94* 91*  CO2 22  --  21*  --  21* 25 26  GLUCOSE 97  --  98  --  98 102* 129*  BUN <5*  --  5*  --  8 9 8   CREATININE 0.36*  --  0.34*  --  0.40* 0.42* 0.40*  CALCIUM 7.5*  --  7.8*  --  7.7* 7.8* 7.8*  MG 1.7  --  1.6*  --  1.8 1.5* 1.9   < > = values in this interval not displayed.   GFR: Estimated Creatinine Clearance: 89.1 mL/min (A) (by C-G formula based on SCr of 0.4 mg/dL (L)). Liver Function Tests: Recent Labs  Lab 04/02/20 0601 04/03/20 0337 04/04/20 0655  AST 98* 101* 82*  ALT 45* 50* 49*  ALKPHOS 109 109 97  BILITOT 1.1 1.1 1.4*  PROT 5.2* 5.2* 4.9*  ALBUMIN 2.1* 2.1* 2.0*   No results for input(s): LIPASE, AMYLASE in the last 168 hours. No results for input(s): AMMONIA in the last 168 hours.  Coagulation Profile: Recent Labs  Lab 04/05/20 0707  INR 1.1   Cardiac Enzymes: No results for input(s): CKTOTAL, CKMB, CKMBINDEX, TROPONINI in the last 168 hours. BNP (last 3 results) No results for input(s): PROBNP in the last 8760 hours. HbA1C: No results for input(s): HGBA1C in the last 72 hours. CBG: Recent Labs  Lab 04/04/20 1710  GLUCAP 135*   Lipid Profile: No results for input(s): CHOL, HDL, LDLCALC, TRIG, CHOLHDL, LDLDIRECT in the last 72 hours. Thyroid Function Tests: No results for input(s): TSH, T4TOTAL, FREET4, T3FREE, THYROIDAB in the last 72 hours. Anemia Panel: No results for input(s): VITAMINB12, FOLATE, FERRITIN, TIBC, IRON, RETICCTPCT in the last 72 hours. Sepsis Labs: No results for input(s): PROCALCITON,  LATICACIDVEN in the last 168 hours.  Recent Results (from the past 240 hour(s))  SARS Coronavirus 2 by RT PCR (hospital order, performed in [facility] hospital lab) Nasopharyngeal Nasopharyngeal Swab     Status: None   Collection Time: 03/30/20  1:37 PM   Specimen: Nasopharyngeal Swab  Result Value Ref Range Status   SARS Coronavirus 2 NEGATIVE NEGATIVE Final    Comment: (NOTE) SARS-CoV-2 target nucleic acids are NOT DETECTED.  The SARS-CoV-2 RNA is generally detectable in upper and lower respiratory specimens during the acute phase of infection. The lowest concentration of SARS-CoV-2 viral copies this assay can detect is 250 copies / mL. A negative result does not preclude SARS-CoV-2 infection and should not be used as the sole basis for treatment or other patient management decisions.  A negative result may occur with improper specimen collection / handling, submission of specimen other than nasopharyngeal swab, presence of viral mutation(s) within the areas targeted by this assay, and inadequate number of viral copies (<250 copies / mL). A negative result must be combined with clinical observations, patient history, and epidemiological information.  Fact Sheet for Patients:   BoilerBrush.com.cy  Fact Sheet for Healthcare Providers: https://pope.com/  This test is not yet approved or  cleared by the [place] FDA and has been authorized for detection and/or diagnosis of SARS-CoV-2 by FDA under an Emergency Use Authorization (EUA).  This EUA will remain in effect (meaning this test can be used) for the duration of the COVID-19 declaration under Section 564(b)(1) of the Act, 21 U.S.C. section 360bbb-3(b)(1), unless the authorization is terminated or revoked sooner.  Performed at [facility], [address] [place], [place] [address]    Radiology Studies: No results found.  Scheduled Meds:  vitamin C  500 mg Oral BID    chlordiazePOXIDE  5 mg Oral TID   feeding supplement (ENSURE ENLIVE)  237 mL Oral BID BM   folic acid  1 mg Oral Daily   furosemide  40 mg Oral Daily   mouth rinse  15 mL Mouth Rinse BID   multivitamin with minerals  1 tablet Oral Daily   nicotine  14 mg Transdermal Daily   spironolactone  25 mg Oral Daily   thiamine  100 mg Oral Daily   Or   thiamine  100 mg Intravenous Daily   Continuous Infusions:   LOS: 9 days   Time spent: 30 minutes  Earma Nicolaou Laural Benes, MD How to contact the [facility] Attending or Consulting provider 7A - 7P or covering provider during after hours 7P -7A, for this patient?  1. Check the care team in [facility] and look for a) attending/consulting TRH provider listed and b) the [facility] team listed 2. Log into [email or web address] and use [place]'s universal password to access.  If you do not have the password, please contact the hospital operator. 3. Locate the [facility] provider you are looking for under [facility] and page to a number that you can be directly reached. 4. If you still have difficulty reaching the provider, please page the [facility] (Director on Call) for the Hospitalists listed on amion for assistance.  [facility]  If 7PM-7AM, please contact night-coverage [email or web address] 04/08/2020, 12:52 PM

## 2020-04-09 LAB — CBC
HCT: 25.8 % — ABNORMAL LOW (ref 39.0–52.0)
Hemoglobin: 9.1 g/dL — ABNORMAL LOW (ref 13.0–17.0)
MCH: 37.6 pg — ABNORMAL HIGH (ref 26.0–34.0)
MCHC: 35.3 g/dL (ref 30.0–36.0)
MCV: 106.6 fL — ABNORMAL HIGH (ref 80.0–100.0)
Platelets: 159 10*3/uL (ref 150–400)
RBC: 2.42 MIL/uL — ABNORMAL LOW (ref 4.22–5.81)
RDW: 12.2 % (ref 11.5–15.5)
WBC: 3.8 10*3/uL — ABNORMAL LOW (ref 4.0–10.5)
nRBC: 0 % (ref 0.0–0.2)

## 2020-04-09 LAB — BASIC METABOLIC PANEL
Anion gap: 10 (ref 5–15)
BUN: 7 mg/dL (ref 6–20)
CO2: 26 mmol/L (ref 22–32)
Calcium: 8 mg/dL — ABNORMAL LOW (ref 8.9–10.3)
Chloride: 90 mmol/L — ABNORMAL LOW (ref 98–111)
Creatinine, Ser: 0.44 mg/dL — ABNORMAL LOW (ref 0.61–1.24)
GFR calc Af Amer: >60 mL/min (ref >60)
GFR calc non Af Amer: >60 mL/min (ref >60)
Glucose, Bld: 104 mg/dL — ABNORMAL HIGH (ref 70–99)
Potassium: 3.4 mmol/L — ABNORMAL LOW (ref 3.5–5.1)
Sodium: 126 mmol/L — ABNORMAL LOW (ref 135–145)

## 2020-04-09 LAB — MAGNESIUM: Magnesium: 1.6 mg/dL — ABNORMAL LOW (ref 1.7–2.4)

## 2020-04-09 MED ORDER — CHLORDIAZEPOXIDE HCL 5 MG PO CAPS
5.0000 mg | ORAL_CAPSULE | Freq: Three times a day (TID) | ORAL | Status: DC | PRN
  Administered 2020-04-09: 5 mg via ORAL
  Filled 2020-04-09: qty 1

## 2020-04-09 MED ORDER — POTASSIUM CHLORIDE CRYS ER 20 MEQ PO TBCR
40.0000 meq | EXTENDED_RELEASE_TABLET | Freq: Once | ORAL | Status: AC
  Administered 2020-04-09: 40 meq via ORAL
  Filled 2020-04-09: qty 2

## 2020-04-09 MED ORDER — MAGNESIUM SULFATE 4 GM/100ML IV SOLN
4.0000 g | Freq: Once | INTRAVENOUS | Status: AC
  Administered 2020-04-09: 4 g via INTRAVENOUS
  Filled 2020-04-09: qty 100

## 2020-04-09 NOTE — Plan of Care (Signed)

## 2020-04-09 NOTE — Progress Notes (Signed)
°   04/09/20 0514  Assess: MEWS Score  Temp (!) 101 F (38.3 C)  BP 129/80  Pulse Rate (!) 120  Resp 20  SpO2 (!) 88 %  O2 Device Room Air  Assess: MEWS Score  MEWS Temp 1  MEWS Systolic 0  MEWS Pulse 2  MEWS RR 0  MEWS LOC 0  MEWS Score 3  MEWS Score Color Yellow  Assess: if the MEWS score is Yellow or Red  Were vital signs taken at a resting state? Yes  Focused Assessment Change from prior assessment (see assessment flowsheet)  Early Detection of Sepsis Score *See Row Information* Low  MEWS guidelines implemented *See Row Information* No, vital signs rechecked  Treat  MEWS Interventions Administered prn meds/treatments  Take Vital Signs  Increase Vital Sign Frequency  Yellow: Q 2hr X 2 then Q 4hr X 2, if remains yellow, continue Q 4hrs  Escalate  MEWS: Escalate Yellow: discuss with charge nurse/RN and consider discussing with provider and RRT

## 2020-04-09 NOTE — Progress Notes (Signed)
PROGRESS NOTE    Clifford Hunt  QQV:[id number] DOB: 1961-05-22 DOA: 03/30/2020 PCP: [facility]   Brief Narrative:  Per HPI: Clifford Phillipsis a 59 y.o.malewith a history of alcoholismwith 5-6 beers a day with little to no oral food intake. Patient was seen at [facility] and had blood work, which showed hyponatremia. The patient was called by his PCPs office and instructed to come to the emergency room for evaluation. He reports feeling weak with little to no energy. No palliating or provoking factors. His last alcohol consumption was earlier today: He had 2 beers prior to getting up. He does report some nausea and lack of appetite. He does have a longstanding history of foods not tasting right, which affects his appetite. His symptoms are unchanged. He does bruise easily, especially in his arms and hands. He has no swelling in his legs.  9/25:Patient was admitted with acute hyponatremia with serum sodium of 114 in the setting of beer Poto mania. This was noted on blood work as noted above. He was noted to have some nausea and change in appetite. His sodium levels have improved to 122 this morning and further IV fluid will be held and he will be maintained on fluid restriction for now with repeat labs in a.m. 2D echocardiogram ordered and pending. Potassium will be supplemented.  9/26:Patient continues to have some weakness but is more awake and alert today. His serum sodium remained stable at 121. Plan to restart IV normal saline. 2D echocardiogram pending. Right upper quadrant ultrasound with findings of liver cirrhosis and gallbladder sludge and stones noted. He appears to be tolerating diet. He will require PT evaluation and likely need placement to SNF/rehab.  9/27: Patient continues to remain weak with sodium levels of 123 noted today.  Plan to continue IV normal saline throughout the day at higher rate and recheck in  a.m.  Replete magnesium.  PT evaluation pending.  9/28: PT has assessed patient with recommendation for SNF noted.  His sodium levels are at 124 today.  Reiterated to nursing staff to obtain urine studies.  Discussed with nephrology regarding possibility for hypertonic saline and they have recommended continuing with normal saline for now until further studies return.  Urine protein/creatinine ratio will also be obtained to further analyze proteinuria.  9/29: sodium unchanged. Nephrology consultation.    9/30: sodium slightly improved.  Diuretics started by nephrology team.  10/2: sodium 126.    10/4: sodium 126.  Insurance auth expired, TOC resent clinicals for insurance auth for SNF today  Assessment & Plan:   Principal Problem:   Acute hyponatremia Active Problems:   Elevated LFTs   Alcohol abuse   Anemia, iron deficiency  Hyponatremia acute versus chronic-stable and slowly improving -Likely related tobeer potomania and poor solute intake -completed IV normal saline and now discontinued as patient is euvolemic -appreciate nephrology consultation -sodium holding stable at 124-126 range.  He remains asymptomatic.  -Continue diuretics per nephrology team.   History of alcohol abuse -Continue alcohol withdrawal protocol with Librium  -Patient has been reluctant to pursue alcohol treatment program in the past.  Transaminitis-stable -Likely secondary to alcohol abusewith noted cirrhosis on ultrasound 9/25  Abnormal CXR -opacity seen possibly focus of pneumonia, repeat cXR today now that he has diuresed better  Adverse reaction to albumin infusion - nephrology ordered albumin 10/3.  Pt starting having adverse reaction with hives and pruritus.  He was given benadryl and symptoms seem to be better. He  also had a fever and sinus tachy.     Proteinuria -Hepatitis and HIV studies negative -Urine protein creatinine ratio ordered per nephrology request for further  evaluation  Mild hypokalemia -Repleted. Follow  Hypomagnesemia - IV replacement ordered.   Liver cirrhosis - diuretics started 9/30 by nephrology team -outpatient GI follow up recommended. -alcohol cessation advised  Anemia/thrombocytopenia-stable -Likely secondary to poor nutrition and ongoing chronic alcohol abuse -Anemia panel orderedwith no acute findings -Follow  Ongoing tobacco abuse -Nicotine patch -Counseled on cessation  Ambulatory dysfunction -SNF when ready for discharge  DVT prophylaxis:SCDs Code Status:Full Family Communication:Discussed with fianc on phone 10/1 Disposition Plan:SNF  Status is: Inpatient  Remains inpatient appropriate because:IV treatments appropriate due to intensity of illness or inability to take PO and Inpatient level of care appropriate due to severity of illness  Dispo: The patient is from:Home Anticipated d/c is to:SNF Anticipated d/c date is: 1 day if insurance auth received for SNF  Patient currently is not medically stable to d/c.PT recommending SNF on discharge.  Nephrology team working on improving sodium levels.     Consultants:  Discussed with Dr. Valentino NosePeeples 9/29  Procedures:  See below  Antimicrobials:   None   Subjective: Patient without complaints.    Objective: Vitals:   04/09/20 0612 04/09/20 0618 04/09/20 0649 04/09/20 1406  BP:    111/84  Pulse: (!) 120 (!) 116 (!) 110 92  Resp:    18  Temp: 98.3 F (36.8 C)   97.6 F (36.4 C)  TempSrc:    Oral  SpO2: 92%   92%  Weight:      Height:        Intake/Output Summary (Last 24 hours) at 04/09/2020 1601 Last data filed at 04/09/2020 1500 Gross per 24 hour  Intake 340 ml  Output 3200 ml  Net -2860 ml   Filed Weights   03/30/20 1228 04/09/20 0514  Weight: 62.6 kg 69.5 kg    Examination:  General exam: chronically ill appearing male, Appears weak but is awake and conversant.  Respiratory  system: shallow BS bilateral. Respiratory effort normal. Cardiovascular system: normal S1 & S2 heard.  Gastrointestinal system: Abdomen is nondistended, soft and nontender.  Central nervous system: Alert and awake Extremities: No edema Skin: No rashes, lesions or ulcers Psychiatry: Flat affect  Data Reviewed: I have personally reviewed following labs and imaging studies  CBC: Recent Labs  Lab 04/03/20 0337 04/04/20 0655 04/09/20 0620  WBC 4.1 4.0 3.8*  HGB 8.9* 9.2* 9.1*  HCT 26.1* 27.0* 25.8*  MCV 112.5* 113.0* 106.6*  PLT 94* 105* 159   Basic Metabolic Panel: Recent Labs  Lab 04/05/20 0707 04/05/20 0707 04/05/20 1424 04/06/20 0400 04/07/20 0655 04/08/20 0604 04/09/20 0620  NA 125*   < > 126* 124* 126* 124* 126*  K 3.5  --   --  3.5 3.4* 3.2* 3.4*  CL 98  --   --  95* 94* 91* 90*  CO2 21*  --   --  21* 25 26 26   GLUCOSE 98  --   --  98 102* 129* 104*  BUN 5*  --   --  8 9 8 7   CREATININE 0.34*  --   --  0.40* 0.42* 0.40* 0.44*  CALCIUM 7.8*  --   --  7.7* 7.8* 7.8* 8.0*  MG 1.6*  --   --  1.8 1.5* 1.9 1.6*   < > = values in this interval not displayed.   GFR: Estimated Creatinine Clearance:  98.9 mL/min (A) (by C-G formula based on SCr of 0.44 mg/dL (L)). Liver Function Tests: Recent Labs  Lab 04/03/20 0337 04/04/20 0655  AST 101* 82*  ALT 50* 49*  ALKPHOS 109 97  BILITOT 1.1 1.4*  PROT 5.2* 4.9*  ALBUMIN 2.1* 2.0*   No results for input(s): LIPASE, AMYLASE in the last 168 hours. No results for input(s): AMMONIA in the last 168 hours. Coagulation Profile: Recent Labs  Lab 04/05/20 0707  INR 1.1   Cardiac Enzymes: No results for input(s): CKTOTAL, CKMB, CKMBINDEX, TROPONINI in the last 168 hours. BNP (last 3 results) No results for input(s): PROBNP in the last 8760 hours. HbA1C: No results for input(s): HGBA1C in the last 72 hours. CBG: Recent Labs  Lab 04/04/20 1710  GLUCAP 135*   Lipid Profile: No results for input(s): CHOL, HDL, LDLCALC,  TRIG, CHOLHDL, LDLDIRECT in the last 72 hours. Thyroid Function Tests: No results for input(s): TSH, T4TOTAL, FREET4, T3FREE, THYROIDAB in the last 72 hours. Anemia Panel: No results for input(s): VITAMINB12, FOLATE, FERRITIN, TIBC, IRON, RETICCTPCT in the last 72 hours. Sepsis Labs: No results for input(s): PROCALCITON, LATICACIDVEN in the last 168 hours.  Recent Results (from the past 240 hour(s))  Culture, blood (Routine X 2) w Reflex to ID Panel     Status: None (Preliminary result)   Collection Time: 04/09/20  8:34 AM   Specimen: BLOOD  Result Value Ref Range Status   Specimen Description BLOOD  Final   Special Requests NONE  Final   Culture   Final    NO GROWTH < 12 HOURS Performed at [facility], [address], [place], [place] [address]    Report Status PENDING  Incomplete  Culture, blood (Routine X 2) w Reflex to ID Panel     Status: None (Preliminary result)   Collection Time: 04/09/20  8:34 AM   Specimen: BLOOD  Result Value Ref Range Status   Specimen Description BLOOD  Final   Special Requests NONE  Final   Culture   Final    NO GROWTH < 12 HOURS Performed at [facility], [address], [place], [place] [address]    Report Status PENDING  Incomplete   Radiology Studies: DG CHEST PORT 1 VIEW  Result Date: 04/08/2020 CLINICAL DATA:  59 year old male with right pleural effusion, possible pneumonia. Negative for COVID-19 on 03/30/2020. EXAM: PORTABLE CHEST 1 VIEW COMPARISON:  Chest radiographs 04/04/2020 and earlier. FINDINGS: Portable AP upright view at 1351 hours. The small right pleural effusion is not evident today. No pneumothorax or pulmonary edema. No confluent pulmonary opacity. Stable lung volumes. Mediastinal contours remain normal. Visualized tracheal air column is within normal limits. No acute osseous abnormality identified. Negative visible bowel gas pattern. IMPRESSION: Negative portable chest. Electronically Signed   By: Odessa Fleming M.D.   On: 04/08/2020 15:12     Scheduled Meds:  vitamin C  500 mg Oral BID   feeding supplement (ENSURE ENLIVE)  237 mL Oral BID BM   folic acid  1 mg Oral Daily   furosemide  40 mg Oral Daily   mouth rinse  15 mL Mouth Rinse BID   multivitamin with minerals  1 tablet Oral Daily   nicotine  14 mg Transdermal Daily   spironolactone  25 mg Oral Daily   thiamine  100 mg Oral Daily   Or   thiamine  100 mg Intravenous Daily   Continuous Infusions:   LOS: 10 days   Time spent: 30 minutes  Standley Dakins, MD How to contact the [facility] Attending or Consulting provider 7A - 7P or covering provider during after hours 7P -7A, for this patient?  1. Check the care team in [facility] and look for a) attending/consulting TRH provider listed and b) the [facility] team listed 2. Log into [email or web address] and use [place]'s universal password to access. If you do not have the password, please contact the hospital operator. 3. Locate the [facility] provider you are looking for under [facility] and page to a number that you can be directly reached. 4. If you still have difficulty reaching the provider, please page the [facility] (Director on Call) for the Hospitalists listed on amion for assistance.  [facility]  If 7PM-7AM, please contact night-coverage [email or web address] 04/09/2020, 4:01 PM

## 2020-04-09 NOTE — TOC Progression Note (Signed)
Transition of Care [facility]) - Progression Note    Patient Details  Name: Clifford Hunt MRN: [id number] Date of Birth: [birth year]  [facility] CM/SW Contact  Leitha Bleak, RN Phone Number: 04/09/2020, 10:19 AM  Clinical Narrative:   TOC continues to keep Amber at [facility] updated. Per [employer] expired today. TOC sent updated clinical she will restart insurance auth. MD updated    Expected Discharge Plan: Skilled Nursing Facility Barriers to Discharge: Continued Medical Work up  Expected Discharge Plan and Services Expected Discharge Plan: Skilled Nursing Facility       Living arrangements for the past 2 months: Single Family Home

## 2020-04-09 NOTE — Progress Notes (Signed)
Nephrology Follow-Up Consult note   Assessment/Recommendations: Clifford Hunt is a/an 59 y.o. male with a past medical history significant for alcohol use disorder, admitted for hyponatremia - found to have cirrhosis.     Hypotonic hyponatremia: Sodium 114 on arrival now stable around 124-126 for several days.  Uosm of 157 and Una of 39. Liver disease favored as main contributor.  (Prior labs with Na of 130 and 135 in 2018).  Note he had also been on paxil.  TSH and cortisol not concerning - stopped paxil/SSRI after 10/2 dose and would not resume  - lasix 40 mg daily -  Given albumin yesterday -  continue spironolactone 25 mg daily  - Continue to limit free water intake to less than 1.8 L daily - earlier Chest x-ray with possible lung opacity; would consider CT scan outpt  Alcohol use disorder: Chronic misuse of alcohol.   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  INR normal.  Albumin low suggestive of some synthetic liver dysfunction.  Thrombocytopenia present.  Ultrasound suggestive of cirrhosis likely related to alcohol.   - spironolactone and lasix as above -Would benefit from GI/hepatology involvement in the outpatient setting   Lung opacity: Consider CT scan as above; outpt is reasonable  Depression - stopped paxil and would not resume; alternate regimen per primary team   Hypomagnesemia: repleting   Hypokalemia: on aldactone and has been repleted today as well   Bicytopenia/pancytopenia: Anemia and thrombocytopenia with intermittently low white blood cell count.  Most likely related to alcohol use disorder causing bone marrow suppression and cirrhosis contributing. Vit B 12 normal. On folate.    Tobacco use disorder:   Disposition:  Think we have on a good oral drug regimen.  Would not expect sodium to get that much higher-  130 is probably the ceiling-  Think would be OK for OP management if other issues are solved -  Mag and K  likely nutritionally related but daily lasix will not help -  Will likely needs supps as OP   ___________________________________________________________    Subjective: he had 3.2 liters UOP, 2700 neg.   Is pretty somnolent-   Not c/o anything  Medications:  Current Facility-Administered Medications  Medication Dose Route Frequency Provider Last Rate Last Admin  . acetaminophen (TYLENOL) tablet 650 mg  650 mg Oral Q6H PRN Zierle-Ghosh, Asia B, DO   650 mg at 04/09/20 0510  . alum & mag hydroxide-simeth (MAALOX/MYLANTA) 200-200-20 MG/5ML suspension 30 mL  30 mL Oral Q4H PRN Sherryll Burger, Pratik D, DO   30 mL at 04/04/20 1523  . ascorbic acid (VITAMIN C) tablet 500 mg  500 mg Oral BID Sherryll Burger, Pratik D, DO   500 mg at 04/09/20 0849  . chlordiazePOXIDE (LIBRIUM) capsule 5 mg  5 mg Oral TID Sherryll Burger, Pratik D, DO   5 mg at 04/09/20 0848  . diphenhydrAMINE (BENADRYL) capsule 25-50 mg  25-50 mg Oral Q6H PRN Johnson, Clanford L, MD   25 mg at 04/08/20 1731  . feeding supplement (ENSURE ENLIVE) (ENSURE ENLIVE) liquid 237 mL  237 mL Oral BID BM Levie Heritage, DO   237 mL at 04/08/20 0944  . folic acid (FOLVITE) tablet 1 mg  1 mg Oral Daily Sherryll Burger, Pratik D, DO   1 mg at 04/09/20 0849  . furosemide (LASIX) tablet 40 mg  40 mg Oral Daily Estanislado Emms, MD   40 mg at 04/09/20 0849  . MEDLINE mouth rinse  15  mL Mouth Rinse BID Levie Heritage, DO   15 mL at 04/09/20 0850  . metoprolol tartrate (LOPRESSOR) injection 10 mg  10 mg Intravenous Q6H PRN Sherryll Burger, Pratik D, DO      . multivitamin with minerals tablet 1 tablet  1 tablet Oral Daily Sherryll Burger, Pratik D, DO   1 tablet at 04/09/20 0848  . nicotine (NICODERM CQ - dosed in mg/24 hours) patch 14 mg  14 mg Transdermal Daily Levie Heritage, DO   14 mg at 04/09/20 0850  . ondansetron (ZOFRAN) tablet 4 mg  4 mg Oral Q6H PRN Levie Heritage, DO       Or  . ondansetron [facility]) injection 4 mg  4 mg Intravenous Q6H PRN Levie Heritage, DO   4 mg at 04/04/20 1411  .  spironolactone (ALDACTONE) tablet 25 mg  25 mg Oral Daily Darnell Level, MD   25 mg at 04/09/20 0849  . thiamine tablet 100 mg  100 mg Oral Daily Levie Heritage, DO   100 mg at 04/09/20 5784   Or  . thiamine (B-1) injection 100 mg  100 mg Intravenous Daily Levie Heritage, DO   100 mg at 03/30/20 1341     Physical Exam: Vitals:   04/09/20 0618 04/09/20 0649  BP:    Pulse: (!) 116 (!) 110  Resp:    Temp:    SpO2:     No intake/output data recorded.  Intake/Output Summary (Last 24 hours) at 04/09/2020 0905 Last data filed at 04/09/2020 0500 Gross per 24 hour  Intake 360 ml  Output 3200 ml  Net -2840 ml   Constitutional: Chronically ill-appearing, no distress   ENMT: ears and nose without scars or lesions, MMM CV: normal rate, trace edema in the bilateral lower extremities Respiratory:  Clear and unlabored on room air Gastrointestinal: soft, mild distention; nontender to palpation Skin: multiple excoriations of skin Psych: alert, judgement/insight appropriate, appropriate mood and affect Neuro - very somnolent today    Lab Results  Component Value Date   NA 126 (L) 04/09/2020   K 3.4 (L) 04/09/2020   CL 90 (L) 04/09/2020   CO2 26 04/09/2020   BUN 7 04/09/2020   CREATININE 0.44 (L) 04/09/2020   CALCIUM 8.0 (L) 04/09/2020   ALBUMIN 2.0 (L) 04/04/2020     Cecille Aver, MD 04/09/2020  9:05 AM

## 2020-04-09 NOTE — Progress Notes (Signed)
   04/09/20 0612  Assess: MEWS Score  Temp 98.3 F (36.8 C)  Pulse Rate (!) 120  SpO2 92 %  Assess: MEWS Score  MEWS Temp 0  MEWS Systolic 0  MEWS Pulse 2  MEWS RR 0  MEWS LOC 0  MEWS Score 2  MEWS Score Color Yellow  Assess: if the MEWS score is Yellow or Red  Were vital signs taken at a resting state? Yes  Focused Assessment No change from prior assessment  Early Detection of Sepsis Score *See Row Information* Low  MEWS guidelines implemented *See Row Information* No, vital signs rechecked

## 2020-04-10 LAB — RENAL FUNCTION PANEL
Albumin: 2 g/dL — ABNORMAL LOW (ref 3.5–5.0)
Anion gap: 8 (ref 5–15)
BUN: 9 mg/dL (ref 6–20)
CO2: 27 mmol/L (ref 22–32)
Calcium: 8 mg/dL — ABNORMAL LOW (ref 8.9–10.3)
Chloride: 92 mmol/L — ABNORMAL LOW (ref 98–111)
Creatinine, Ser: 0.45 mg/dL — ABNORMAL LOW (ref 0.61–1.24)
GFR calc Af Amer: >60 mL/min (ref >60)
GFR calc non Af Amer: >60 mL/min (ref >60)
Glucose, Bld: 101 mg/dL — ABNORMAL HIGH (ref 70–99)
Phosphorus: 4 mg/dL (ref 2.5–4.6)
Potassium: 3.5 mmol/L (ref 3.5–5.1)
Sodium: 127 mmol/L — ABNORMAL LOW (ref 135–145)

## 2020-04-10 LAB — MAGNESIUM: Magnesium: 2 mg/dL (ref 1.7–2.4)

## 2020-04-10 LAB — SARS CORONAVIRUS 2 BY RT PCR (HOSPITAL ORDER, PERFORMED IN ~~LOC~~ HOSPITAL LAB): SARS Coronavirus 2: POSITIVE — AB

## 2020-04-10 MED ORDER — ENSURE ENLIVE PO LIQD: 237.0000 mL | Freq: Two times a day (BID) | ORAL | 12 refills | Status: DC

## 2020-04-10 MED ORDER — POTASSIUM CHLORIDE CRYS ER 20 MEQ PO TBCR
40.0000 meq | EXTENDED_RELEASE_TABLET | Freq: Once | ORAL | Status: DC
  Filled 2020-04-10: qty 2

## 2020-04-10 MED ORDER — CHLORDIAZEPOXIDE HCL 5 MG PO CAPS: 5.0000 mg | ORAL_CAPSULE | Freq: Three times a day (TID) | ORAL | 0 refills | Status: DC | PRN

## 2020-04-10 MED ORDER — POTASSIUM CHLORIDE CRYS ER 10 MEQ PO TBCR: 10.0000 meq | EXTENDED_RELEASE_TABLET | Freq: Every day | ORAL | Status: DC

## 2020-04-10 MED ORDER — NICOTINE 14 MG/24HR TD PT24: 14.0000 mg | MEDICATED_PATCH | Freq: Every day | TRANSDERMAL | 0 refills | Status: DC | PRN

## 2020-04-10 MED ORDER — THIAMINE HCL 100 MG PO TABS: 100.0000 mg | ORAL_TABLET | Freq: Every day | ORAL | Status: DC

## 2020-04-10 MED ORDER — FUROSEMIDE 40 MG PO TABS
40.0000 mg | ORAL_TABLET | Freq: Every day | ORAL | Status: DC
Start: 2020-04-10 — End: 2023-05-13

## 2020-04-10 MED ORDER — ADULT MULTIVITAMIN W/MINERALS CH: 1.0000 | ORAL_TABLET | Freq: Every day | ORAL | Status: AC

## 2020-04-10 MED ORDER — FOLIC ACID 1 MG PO TABS: 1.0000 mg | ORAL_TABLET | Freq: Every day | ORAL | Status: DC

## 2020-04-10 MED ORDER — ASCORBIC ACID 500 MG PO TABS: 500.0000 mg | ORAL_TABLET | Freq: Two times a day (BID) | ORAL | Status: DC

## 2020-04-10 MED ORDER — SPIRONOLACTONE 25 MG PO TABS: 25.0000 mg | ORAL_TABLET | Freq: Every day | ORAL | Status: DC

## 2020-04-10 NOTE — Progress Notes (Signed)
CRITICAL VALUE ALERT  Critical Value:  Covid (+)  Date & Time Notied:  04/10/2020 @ 1428  Provider Notified: MD Laural Benes  Orders Received/Actions taken: none at this time

## 2020-04-10 NOTE — Progress Notes (Signed)
Girlfriend to desk, requests assistance to get pt up on [facility] for BM.  Due to (+) coivd test result, pt is now being placed in airborne/contact precautions. PPE donned by staff and in to room. Pt and girlfriend questioned PPE use at this time. Advised both pt and girlfriend that pt has now had a (+) Covid result. Pt and girlfriend both upset, wants to know how he got the virus if he's only been here in the hospital for the past 11 days. Advised both that it is possible this could be a hospital acquired infection but could also be related to girlfriends visitation, as she does not wear her mask in patient's room. This explanation was accepted but not entirely pleasing to pt or girlfriend. Girlfriend advised that she needs to have testing done as soon as possible, and is agreeable. Advised that pt would be moved to a negative pressure room due to transmission precaution protocol and both stated understanding. Pt states, "I'm pissed! I wanted to leave today." Advised both pt and girlfriend that I could not advise any future plans related to his care and discharge, as I was waiting for feedback and information from both MD Laural Benes and Case Management/Social Worker. Advised that I have asked MD and SW to update them as decisions are made. Pt and girlfriend both state understanding.

## 2020-04-10 NOTE — Progress Notes (Signed)
Physical Therapy Treatment Patient Details Name: Clifford Hunt MRN: [id number] DOB: 1961-02-19 Today's Date: 04/10/2020    History of Present Illness Clifford Hunt is a 59 y.o. male with a history of alcoholism with 5-6 beers a day with little to no oral food intake.  Patient was seen at [facility] and had blood work, which showed hyponatremia.  The patient was called by his PCPs office and instructed to come to the emergency room for evaluation.  He reports feeling weak with little to no energy.  No palliating or provoking factors.  His last alcohol consumption was earlier today: He had 2 beers prior to getting up.  He does report some nausea and lack of appetite.  He does have a longstanding history of foods not tasting right, which affects his appetite.  His symptoms are unchanged.  He does bruise easily, especially in his arms and hands.  He has no swelling in his legs.    PT Comments    Pt agreeable to therapy. Initial supine to sit transfer with heavy cues on hand placement and sequencing with pt maintaining heavy posterior trunk lean with minimal improvement with multimodal cues to improve. Pt with HR max 125bpm noted in sitting and reports "something" when questioned about lightheadedness or heart racing sensation so assisted back to supine. Performed additional supine to sit transfer with heavy cues on hand placement and sequencing, pt attempts to use hands on bed to improve sitting posture, but requires mod/max A to maintain static sitting with heavy posterior lean. STS transfer from elevated EOB with min A, heavy posterior lean with BLE braced on bed and toes off floor. Pt tolerates 2-3 side shuffling steps before therapist provided slow controlled return to sitting and assisted back to supine. Pt fatigued with activity and max HR 129bpm noted in standing. Therapist repositioned pt in supine and pt's sister in room assisting with meal tray set up. RN notified of HR during  session. Patient will benefit from continued physical therapy in hospital and recommendations below to increase strength, balance, endurance for safe ADLs and gait.   Follow Up Recommendations  SNF;Supervision/Assistance - 24 hour     Equipment Recommendations  None recommended by PT    Recommendations for Other Services       Precautions / Restrictions Precautions Precautions: Fall Precaution Comments: monitor HR Restrictions Weight Bearing Restrictions: No    Mobility  Bed Mobility Overal bed mobility: Needs Assistance Bed Mobility: Supine to Sit;Sit to Supine  Supine to sit: Mod assist;HOB elevated Sit to supine: Mod assist   General bed mobility comments: significant increased time, constant cues for hand placement and sequencing to rise up to sitting, mod A to upright trunk into sitting and lift BLE back into bed into supine and to reposition in bed  Transfers Overall transfer level: Needs assistance Equipment used: 1 person hand held assist Transfers: Sit to/from Stand Sit to Stand: From elevated surface;Min assist    General transfer comment: therapist positioned in front of pt, min A to power up from elevated bed, heavy posterior lean with feet flexed and BLE braced on bed  Ambulation/Gait Ambulation/Gait assistance: Mod assist;Max assist  General Gait Details: 2-3 sidesteps to [facility], sliding foot progression, heavy posterior lean with BLE braced on bed and toes dorsiflexed off floor   Stairs             Wheelchair Mobility    Modified Rankin (Stroke Patients Only)       Balance Overall  balance assessment: Needs assistance Sitting-balance support: Feet supported;Bilateral upper extremity supported Sitting balance-Leahy Scale: Zero Sitting balance - Comments: mod-max A to maintain static supported sitting, heavy posterior lean with minimal improvement when cued Postural control: Posterior lean Standing balance support: During functional  activity;Bilateral upper extremity supported Standing balance-Leahy Scale: Zero Standing balance comment: max A to maintain static standing and for sidestepping           Cognition Arousal/Alertness: Awake/alert Behavior During Therapy: WFL for tasks assessed/performed Overall Cognitive Status: Within Functional Limits for tasks assessed       Exercises      General Comments General comments (skin integrity, edema, etc.): HR 125 max with sitting, 129 max with standing and sidesteps      Pertinent Vitals/Pain Pain Assessment: No/denies pain    Home Living             Prior Function            PT Goals (current goals can now be found in the care plan section) Acute Rehab PT Goals Patient Stated Goal: Go to SNF to get stronger PT Goal Formulation: With patient/family Time For Goal Achievement: 04/16/20 Potential to Achieve Goals: Good Progress towards PT goals: Progressing toward goals    Frequency    Min 3X/week      PT Plan Current plan remains appropriate    Co-evaluation              AM-PAC PT "6 Clicks" Mobility   Outcome Measure  Help needed turning from your back to your side while in a flat bed without using bedrails?: A Lot Help needed moving from lying on your back to sitting on the side of a flat bed without using bedrails?: A Lot Help needed moving to and from a bed to a chair (including a wheelchair)?: A Lot Help needed standing up from a chair using your arms (e.g., wheelchair or bedside chair)?: A Lot Help needed to walk in hospital room?: Total Help needed climbing 3-5 steps with a railing? : Total 6 Click Score: 10    End of Session   Activity Tolerance: Patient limited by fatigue;Other (comment) (limited by HR) Patient left: in bed;with call bell/phone within reach;with bed alarm set;with family/visitor present Nurse Communication: Mobility status;Other (comment) (HR) PT Visit Diagnosis: Unsteadiness on feet (R26.81);Other  abnormalities of gait and mobility (R26.89);Repeated falls (R29.6);Muscle weakness (generalized) (M62.81);Difficulty in walking, not elsewhere classified (R26.2)     Time: 3500-9381 PT Time Calculation (min) (ACUTE ONLY): 23 min  Charges:  $Therapeutic Activity: 23-37 mins                      Tori Shyloh Krinke PT, DPT 04/10/20, 12:12 PM [phone]

## 2020-04-10 NOTE — TOC Progression Note (Addendum)
Transition of Care [facility]) - Progression Note    Patient Details  Name: Clifford Hunt MRN: [id number] Date of Birth: 05/22/1961  [facility] CM/SW Contact  Leitha Bleak, RN Phone Number: 04/10/2020, 3:32 PM  Clinical Narrative:   Patient tested COVID positive. [facility] does not take COVID patients. They suggested [facility]. TOC sent out and to other SNF's that accept COVID patients. Amber at [facility] is still working on [facility], she states she can transfer once she gets approval. TOC to follow for bed offers. Avoidable days added.    ADDENDUM: [facility] made a bed offer, Girlfriend - Pam Accepted.  Navi called and will transfer [facility] AUTH and send to admissions at [facility].  They do not have staff to admit him tonight. Asking for AM discharge.  TOC sent DC summary in hub.    Expected Discharge Plan: Skilled Nursing Facility Barriers to Discharge: SNF Pending bed offer  Expected Discharge Plan and Services Expected Discharge Plan: Skilled Nursing Facility    Living arrangements for the past 2 months: Single Family Home Expected Discharge Date: 04/10/20

## 2020-04-10 NOTE — Discharge Summary (Addendum)
Physician Discharge Summary  Clifford Hunt ZOX:[id number] DOB: 1961/06/21 DOA: 03/30/2020  PCP: [facility]  Admit date: 03/30/2020 Discharge date: 04/11/2020  Admitted From:  Home  Disposition:  SNF   Recommendations for Outpatient Follow-up:  1. Follow up with PCP in 2 weeks 2. Follow up with GI in 1 month.  3. Please check BMP and magnesium in 1 week.   Discharge Condition: STABLE   CODE STATUS: FULL    Brief Hospitalization Summary: Please see all hospital notes, images, labs for full details of the hospitalization. ADMISSION HPI: Clifford Hunt a 59 y.o.malewith a history of alcoholismwith 5-6 beers a day with little to no oral food intake. Patient was seen at [facility] and had blood work, which showed hyponatremia. The patient was called by his PCPs office and instructed to come to the emergency room for evaluation. He reports feeling weak with little to no energy. No palliating or provoking factors. His last alcohol consumption was earlier today: He had 2 beers prior to getting up. He does report some nausea and lack of appetite. He does have a longstanding history of foods not tasting right, which affects his appetite. His symptoms are unchanged. He does bruise easily, especially in his arms and hands. He has no swelling in his legs.  9/25:Patient was admitted with acute hyponatremia with serum sodium of 114 in the setting of beer [place] mania. This was noted on blood work as noted above. He was noted to have some nausea and change in appetite. His sodium levels have improved to 122 this morning and further IV fluid will be held and he will be maintained on fluid restriction for now with repeat labs in a.m. 2D echocardiogram ordered and pending. Potassium will be supplemented.  9/26:Patient continues to have some weakness but is more awake and alert today. His serum sodium remained stable at 121. Plan to restart IV  normal saline. 2D echocardiogram pending. Right upper quadrant ultrasound with findings of liver cirrhosis and gallbladder sludge and stones noted. He appears to be tolerating diet. He will require PT evaluation and likely need placement to SNF/rehab.  9/27:Patient continues to remain weak with sodium levels of 123 noted today. Plan to continue IV normal saline throughout the day at higher rate and recheck in a.m. Replete magnesium. PT evaluation pending.  9/28: PT has assessed patient with recommendation for SNF noted.  His sodium levels are at 124 today.  Reiterated to nursing staff to obtain urine studies.  Discussed with nephrology regarding possibility for hypertonic saline and they have recommended continuing with normal saline for now until further studies return.  Urine protein/creatinine ratio will also be obtained to further analyze proteinuria.  9/29: sodium unchanged. Nephrology consultation.    9/30: sodium slightly improved.  Diuretics started by nephrology team.  10/2: sodium 126.    10/4: sodium 126.  Insurance auth expired, TOC resent clinicals for insurance auth for SNF today  04/10/20: sodium 127:  Nephrology rounded and signed off. Pt is improved.  He will continue medical management with diuretics as ordered.  Pt stable to discharge to SNF. He remains totally asymptomatic.   Update: Pt tested positive for Covid.  Asymptomatic on room air negative chest xray. -  DC  to a SNF that accepts Covid patients on 04/11/20.   04/11/20--- positive Covid test while being screened for transfer to SNF today on 04/10/20 Pt asymptomatic from Covid standpoint.  Tested negative on admission to the Hospital on  03/30/20....   Placed in isolation.  CXR 04/08/20 was negative for infiltrate.    Pt on room air pulse ox 95% -Received Regeneron monoclonal antibody on 04/11/2020 -strongly advised to  to isolate/quarantine for at least 21 days from the date of  diagnoses with COVID-19  infection--please always wear a mask if you have to go outside the house  Assessment & Plan:   Principal Problem:   Acute hyponatremia Active Problems:   Elevated LFTs   Alcohol abuse   Anemia, iron deficiency  Hyponatremia acute versus chronic-stableand slowly improving -Likely related tobeer potomania and poor solute intake -completed IV normal saline and now discontinued as patient is euvolemic -appreciate nephrology consultation -sodium holding stable at 124-126 range.  He remains asymptomatic.  Sodium 127 today.  -Continue diuretics per nephrology team.  They have signed off and he is stable to DC to SNF.  -Recommend recheck BMP and magnesium in 1 week.    History of alcohol abuse -Continue alcohol withdrawal protocol with Librium  -Patient has been reluctant to pursue alcohol treatment program in the past.  Liver Cirrhosis - Ambulatory referral to Gastroenterology made on 04/10/20.   Transaminitis-stable -Likely secondary to alcohol abusewith noted cirrhosis on ultrasound 9/25  Abnormal CXR -opacity seen possibly focus of pneumonia, repeat cXR with improvement   Adverse reaction to albumin infusion - RESOLVED - nephrology ordered albumin 10/3.  Pt starting having adverse reaction with hives and pruritus.  He was given benadryl and symptoms seem to be better. He also had a fever and sinus tachy.     Proteinuria -Hepatitis and HIV studies negative  Mild hypokalemia -Repleted. Follow  Hypomagnesemia - IV replacement ordered.   Liver cirrhosis - diuretics started 9/30 by nephrology team -outpatient GI follow up recommended. -alcohol cessation advised  Anemia/thrombocytopenia-stable -Likely secondary to poor nutrition and ongoing chronic alcohol abuse -Anemia panel orderedwith no acute findings -Follow  Ongoing tobacco abuse -Nicotine patch -Counseled on cessation  Ambulatory dysfunction -SNF placement for acute rehabilitation   -04/11/20---  positive Covid test while being screened for transfer to SNF today on 04/10/20 Pt asymptomatic from Covid standpoint.  Tested negative on admission to the Hospital on 03/30/20....   Placed in isolation.  CXR 04/08/20 was negative for infiltrate.    Pt on room air pulse ox 95% -Received Regeneron monoclonal antibody on 04/11/2020 -strongly advised to  to isolate/quarantine for at least 21 days from the date of  diagnoses with COVID-19 infection--please always wear a mask if you have to go outside the house  DVT prophylaxis:SCDs Code Status:Full Family Communication:Discussed with fianc on phone on 04/11/20 Disposition Plan:SNF  Status is: Inpatient  Discharge Diagnoses:  Principal Problem:   Acute hyponatremia Active Problems:   COVID-19 virus infection-asymptomatic/received monoclonal antibody   Elevated LFTs   Alcohol abuse   Anemia, iron deficiency  Discharge Instructions: Discharge Instructions    Ambulatory referral to Gastroenterology   Complete by: As directed      Allergies as of 04/11/2020      Reactions   Morphine And Related Shortness Of Breath   Albumin (human) Hives, Itching, Palpitations      Medication List    STOP taking these medications   naltrexone 50 MG tablet Commonly known as: DEPADE   PARoxetine 20 MG tablet Commonly known as: PAXIL     TAKE these medications   ascorbic acid 500 MG tablet Commonly known as: VITAMIN C Take 1 tablet (500 mg total) by mouth 2 (two) times daily.  chlordiazePOXIDE 5 MG capsule Commonly known as: LIBRIUM Take 1 capsule (5 mg total) by mouth 3 (three) times daily as needed for anxiety.   feeding supplement (ENSURE ENLIVE) Liqd Take 237 mLs by mouth 2 (two) times daily between meals.   folic acid 1 MG tablet Commonly known as: FOLVITE Take 1 tablet (1 mg total) by mouth daily.   furosemide 40 MG tablet Commonly known as: LASIX Take 1 tablet (40 mg total) by mouth daily.   multivitamin with minerals Tabs  tablet Take 1 tablet by mouth daily.   nicotine 14 mg/24hr patch Commonly known as: NICODERM CQ - dosed in mg/24 hours Place 1 patch (14 mg total) onto the skin daily as needed (nicotine cravings).   potassium chloride 10 MEQ tablet Commonly known as: KLOR-CON Take 1 tablet (10 mEq total) by mouth daily.   spironolactone 25 MG tablet Commonly known as: ALDACTONE Take 1 tablet (25 mg total) by mouth daily.   thiamine 100 MG tablet Take 1 tablet (100 mg total) by mouth daily.       Contact information for follow-up providers    [facility] Schedule an appointment as soon as possible for a visit in 1 week(s).   Why: Hospital Follow Up and check labs.  Contact information: [address] [place] [address] [phone]        [facility]. Schedule an appointment as soon as possible for a visit in 1 month(s).   Why: Establish care for alcoholic cirrhosis  Contact information: [address] [place] [address] [phone]           Contact information for after-discharge care    Destination    [facility] .   Service: Skilled Nursing Contact information: [address] [place] [address] [phone]                 Allergies  Allergen Reactions  . Morphine And Related Shortness Of Breath  . Albumin (Human) Hives, Itching and Palpitations   Allergies as of 04/11/2020      Reactions   Morphine And Related Shortness Of Breath   Albumin (human) Hives, Itching, Palpitations      Medication List    STOP taking these medications   naltrexone 50 MG tablet Commonly known as: DEPADE   PARoxetine 20 MG tablet Commonly known as: PAXIL     TAKE these medications   ascorbic acid 500 MG tablet Commonly known as: VITAMIN C Take 1 tablet (500 mg total) by mouth 2 (two) times daily.   chlordiazePOXIDE 5 MG capsule Commonly known as: LIBRIUM Take 1  capsule (5 mg total) by mouth 3 (three) times daily as needed for anxiety.   feeding supplement (ENSURE ENLIVE) Liqd Take 237 mLs by mouth 2 (two) times daily between meals.   folic acid 1 MG tablet Commonly known as: FOLVITE Take 1 tablet (1 mg total) by mouth daily.   furosemide 40 MG tablet Commonly known as: LASIX Take 1 tablet (40 mg total) by mouth daily.   multivitamin with minerals Tabs tablet Take 1 tablet by mouth daily.   nicotine 14 mg/24hr patch Commonly known as: NICODERM CQ - dosed in mg/24 hours Place 1 patch (14 mg total) onto the skin daily as needed (nicotine cravings).   potassium chloride 10 MEQ tablet Commonly known as: KLOR-CON Take 1 tablet (10 mEq total) by mouth daily.   spironolactone 25 MG tablet Commonly known  as: ALDACTONE Take 1 tablet (25 mg total) by mouth daily.   thiamine 100 MG tablet Take 1 tablet (100 mg total) by mouth daily.       Procedures/Studies: DG Chest 2 View  Result Date: 04/04/2020 CLINICAL DATA:  Shortness of breath EXAM: CHEST - 2 VIEW COMPARISON:  February 18, 2017; chest CT April 03, 2014 FINDINGS: There is a small right pleural effusion. There is an area of ill-defined opacity in the medial left base. Lungs elsewhere clear. Heart size and pulmonary vascularity are normal. No adenopathy. There is degenerative change in each shoulder. IMPRESSION: Small right pleural effusion. Opacity medial left base, likely a focus of pneumonia. Lungs elsewhere clear. Heart size normal. No adenopathy evident. Electronically Signed   By: Bretta BangWilliam  Woodruff III M.D.   On: 04/04/2020 12:18   US Abdomen Complete  Result Date: 04/04/2020 CLINICAL DATA:  Elevated liver enzymes EXAM: ABDOMEN ULTRASOUND COMPLETE COMPARISON:  March 31, 2020 ultrasound right upper quadrant FINDINGS: Gallbladder: Sludge is noted within the gallbladder. No echogenic foci which move and shadow as is expected with gallstones evident currently. No gallbladder wall  thickening or pericholecystic fluid. No sonographic Murphy sign noted by sonographer. Common bile duct: Diameter: 5 mm. No intrahepatic, common hepatic, or common bile duct dilatation. Liver: No focal lesion identified. Liver contour is nodular. Liver overall appears somewhat small with a somewhat coarsened overall increased echotexture pattern. Portal vein is patent on color Doppler imaging with normal direction of blood flow towards the liver. IVC: No abnormality visualized in areas which can be assessed. Most of the inferior vena cava is obscured by gas. Pancreas: Essentially completely obscured by gas. Spleen: Size and appearance within normal limits. Right Kidney: Length: 11.5 cm. Echogenicity within normal limits. No mass or hydronephrosis visualized. Left Kidney: Length: 12.5 cm. Echogenicity within normal limits. No mass or hydronephrosis visualized. There is slight perinephric fluid. Abdominal aorta: No aneurysm visualized. Other findings: Moderate ascites. IMPRESSION: 1. The appearance of the liver is indicative of hepatic cirrhosis. No focal liver lesions evident. It should be noted that the sensitivity of ultrasound for detection of focal liver lesions is diminished significantly in this circumstance. 2.  Moderate ascites. 3. Sludge in gallbladder. No gallstones evident. No gallbladder wall thickening. 4. Pancreas obscured by gas. Most of inferior vena cava obscured by gas. 5. Slight perinephric fluid left kidney, a finding of questionable significance. This fluid potentially could be due to ascites. Electronically Signed   By: Bretta BangWilliam  Woodruff III M.D.   On: 04/04/2020 12:17   DG CHEST PORT 1 VIEW  Result Date: 04/08/2020 CLINICAL DATA:  59 year old male with right pleural effusion, possible pneumonia. Negative for COVID-19 on 03/30/2020. EXAM: PORTABLE CHEST 1 VIEW COMPARISON:  Chest radiographs 04/04/2020 and earlier. FINDINGS: Portable AP upright view at 1351 hours. The small right pleural  effusion is not evident today. No pneumothorax or pulmonary edema. No confluent pulmonary opacity. Stable lung volumes. Mediastinal contours remain normal. Visualized tracheal air column is within normal limits. No acute osseous abnormality identified. Negative visible bowel gas pattern. IMPRESSION: Negative portable chest. Electronically Signed   By: Odessa FlemingH  Hall M.D.   On: 04/08/2020 15:12   US Abdomen Limited RUQ  Result Date: 03/31/2020 CLINICAL DATA:  59 year old male with elevated LFTs. EXAM: ULTRASOUND ABDOMEN LIMITED RIGHT UPPER QUADRANT COMPARISON:  None. FINDINGS: Gallbladder: The gallbladder is distended containing sludge and probable small mobile gallstones. Gallbladder wall thickening is identified. No sonographic Eulah PontMurphy sign is present. Common bile duct: Diameter: 3.7 mm.  No biliary dilatation. Liver: Diffuse increase in echogenicity noted. The hepatic contour appears slightly nodular suspicious for cirrhosis. Increased heterogeneous hepatic echotexture noted without focal hepatic mass. Portal vein is patent on color Doppler imaging with normal direction of blood flow towards the liver. Other: Perihepatic ascites is noted. IMPRESSION: 1. Gallbladder sludge and probable small mobile gallstones with gallbladder wall thickening. This wall thickening may be related to hepatic dysfunction/ascites but consider nuclear medicine study if there is strong clinical suspicion for cystic duct obstruction/acute cholecystitis. 2. No evidence of biliary dilatation. 3. Question hepatic steatosis and cirrhosis. 4. Perihepatic ascites. Electronically Signed   By: Harmon Pier M.D.   On: 03/31/2020 10:56     Subjective: Pt in good spirits today.  No complaints.    Discharge Exam: Vitals:   04/11/20 0700 04/11/20 1211  BP: 98/75 104/71  Pulse: (!) 108 (!) 103  Resp: 18 18  Temp: 97.8 F (36.6 C) 98.2 F (36.8 C)  SpO2: 92% 95%   Vitals:   04/10/20 2040 04/10/20 2300 04/11/20 0700 04/11/20 1211  BP:   115/86 98/75 104/71  Pulse:  (!) 103 (!) 108 (!) 103  Resp:  18 18 18   Temp:  99.3 F (37.4 C) 97.8 F (36.6 C) 98.2 F (36.8 C)  TempSrc:  Oral Oral Oral  SpO2: 91% 100% 92% 95%  Weight:   63.3 kg   Height:       General exam: chronically ill appearing male, Appears weak but is awake and conversant.  Respiratory system: Fair air movement bilaterally, no respiratory distress, no wheezing Cardiovascular system: normal S1 & S2 heard.  Gastrointestinal system: Abdomen is nondistended, soft and nontender.  Central nervous system: Alert and awake, generalized weakness without new focal deficits Extremities: No edema Skin: No rashes, lesions or ulcers Psychiatry: normal affect   The results of significant diagnostics from this hospitalization (including imaging, microbiology, ancillary and laboratory) are listed below for reference.     Microbiology: Recent Results (from the past 240 hour(s))  Culture, blood (Routine X 2) w Reflex to ID Panel     Status: None (Preliminary result)   Collection Time: 04/09/20  8:34 AM   Specimen: BLOOD LEFT HAND  Result Value Ref Range Status   Specimen Description BLOOD LEFT HAND  Final   Special Requests   Final    BOTTLES DRAWN AEROBIC AND ANAEROBIC Blood Culture adequate volume   Culture   Final    NO GROWTH 2 DAYS Performed at [facility], [address], [place], [place]    Report Status PENDING  Incomplete  Culture, blood (Routine X 2) w Reflex to ID Panel     Status: None (Preliminary result)   Collection Time: 04/09/20  8:34 AM   Specimen: BLOOD RIGHT HAND  Result Value Ref Range Status   Specimen Description BLOOD RIGHT HAND  Final   Special Requests   Final    BOTTLES DRAWN AEROBIC AND ANAEROBIC Blood Culture adequate volume   Culture   Final    NO GROWTH 2 DAYS Performed at [facility], [address], [place], [place]    Report Status PENDING  Incomplete  SARS Coronavirus 2 by RT PCR (hospital order,  performed in [facility] hospital lab) Nasopharyngeal Nasopharyngeal Swab     Status: Abnormal   Collection Time: 04/10/20 11:15 AM   Specimen: Nasopharyngeal Swab  Result Value Ref Range Status   SARS Coronavirus 2 POSITIVE (A) NEGATIVE Final    Comment: RESULT  CALLED TO, READ BACK BY AND VERIFIED WITH: CRYSTAL REYNOLDS @1419  ON 04/10/20 BY TJ (NOTE) SARS-CoV-2 target nucleic acids are DETECTED  SARS-CoV-2 RNA is generally detectable in upper respiratory specimens  during the acute phase of infection.  Positive results are indicative  of the presence of the identified virus, but do not rule out bacterial infection or co-infection with other pathogens not detected by the test.  Clinical correlation with patient history and  other diagnostic information is necessary to determine patient infection status.  The expected result is negative.  Fact Sheet for Patients:   06/10/20   Fact Sheet for Healthcare Providers:   BoilerBrush.com.cy    This test is not yet approved or cleared by the https://pope.com/ FDA and  has been authorized for detection and/or diagnosis of SARS-CoV-2 by FDA under an Emergency Use Authorization (EUA).  This EUA will remain in effect (meaning th is test can be used) for the duration of  the COVID-19 declaration under Section 564(b)(1) of the Act, 21 U.S.C. section 360-bbb-3(b)(1), unless the authorization is terminated or revoked sooner.  Performed at [facility], [address], [place], [place]      Labs: BNP (last 3 results) No results for input(s): BNP in the last 8760 hours. Basic Metabolic Panel: Recent Labs  Lab 04/06/20 0400 04/07/20 0655 04/08/20 0604 04/09/20 0620 04/10/20 0445  NA 124* 126* 124* 126* 127*  K 3.5 3.4* 3.2* 3.4* 3.5  CL 95* 94* 91* 90* 92*  CO2 21* 25 26 26 27   GLUCOSE 98 102* 129* 104* 101*  BUN 8 9 8 7 9   CREATININE 0.40* 0.42* 0.40* 0.44* 0.45*  CALCIUM  7.7* 7.8* 7.8* 8.0* 8.0*  MG 1.8 1.5* 1.9 1.6* 2.0  PHOS  --   --   --   --  4.0   Liver Function Tests: Recent Labs  Lab 04/10/20 0445  ALBUMIN 2.0*   No results for input(s): LIPASE, AMYLASE in the last 168 hours. No results for input(s): AMMONIA in the last 168 hours. CBC: Recent Labs  Lab 04/09/20 0620  WBC 3.8*  HGB 9.1*  HCT 25.8*  MCV 106.6*  PLT 159   Cardiac Enzymes: No results for input(s): CKTOTAL, CKMB, CKMBINDEX, TROPONINI in the last 168 hours. BNP: Invalid input(s): POCBNP CBG: Recent Labs  Lab 04/04/20 1710  GLUCAP 135*   D-Dimer No results for input(s): DDIMER in the last 72 hours. Hgb A1c No results for input(s): HGBA1C in the last 72 hours. Lipid Profile No results for input(s): CHOL, HDL, LDLCALC, TRIG, CHOLHDL, LDLDIRECT in the last 72 hours. Thyroid function studies No results for input(s): TSH, T4TOTAL, T3FREE, THYROIDAB in the last 72 hours.  Invalid input(s): FREET3 Anemia work up No results for input(s): VITAMINB12, FOLATE, FERRITIN, TIBC, IRON, RETICCTPCT in the last 72 hours. Urinalysis    Component Value Date/Time   COLORURINE YELLOW 04/01/2020 0343   APPEARANCEUR CLOUDY (A) 04/01/2020 0343   LABSPEC 1.002 (L) 04/01/2020 0343   PHURINE 7.0 04/01/2020 0343   GLUCOSEU NEGATIVE 04/01/2020 0343   HGBUR LARGE (A) 04/01/2020 0343   BILIRUBINUR NEGATIVE 04/01/2020 0343   KETONESUR NEGATIVE 04/01/2020 0343   PROTEINUR >=300 (A) 04/01/2020 0343   NITRITE NEGATIVE 04/01/2020 0343   LEUKOCYTESUR NEGATIVE 04/01/2020 0343   Sepsis Labs Invalid input(s): PROCALCITONIN,  WBC,  LACTICIDVEN Microbiology Recent Results (from the past 240 hour(s))  Culture, blood (Routine X 2) w Reflex to ID Panel     Status: None (Preliminary result)  Collection Time: 04/09/20  8:34 AM   Specimen: BLOOD LEFT HAND  Result Value Ref Range Status   Specimen Description BLOOD LEFT HAND  Final   Special Requests   Final    BOTTLES DRAWN AEROBIC AND ANAEROBIC  Blood Culture adequate volume   Culture   Final    NO GROWTH 2 DAYS Performed at [facility], [address] [place], [place] [address]    Report Status PENDING  Incomplete  Culture, blood (Routine X 2) w Reflex to ID Panel     Status: None (Preliminary result)   Collection Time: 04/09/20  8:34 AM   Specimen: BLOOD RIGHT HAND  Result Value Ref Range Status   Specimen Description BLOOD RIGHT HAND  Final   Special Requests   Final    BOTTLES DRAWN AEROBIC AND ANAEROBIC Blood Culture adequate volume   Culture   Final    NO GROWTH 2 DAYS Performed at [facility], [address] [place], [place] [address]    Report Status PENDING  Incomplete  SARS Coronavirus 2 by RT PCR (hospital order, performed in [facility] hospital lab) Nasopharyngeal Nasopharyngeal Swab     Status: Abnormal   Collection Time: 04/10/20 11:15 AM   Specimen: Nasopharyngeal Swab  Result Value Ref Range Status   SARS Coronavirus 2 POSITIVE (A) NEGATIVE Final    Comment: RESULT CALLED TO, READ BACK BY AND VERIFIED WITH: CRYSTAL REYNOLDS  ON 04/10/20 BY TJ (NOTE) SARS-CoV-2 target nucleic acids are DETECTED  SARS-CoV-2 RNA is generally detectable in upper respiratory specimens  during the acute phase of infection.  Positive results are indicative  of the presence of the identified virus, but do not rule out bacterial infection or co-infection with other pathogens not detected by the test.  Clinical correlation with patient history and  other diagnostic information is necessary to determine patient infection status.  The expected result is negative.  Fact Sheet for Patients:   BoilerBrush.com.cy   Fact Sheet for Healthcare Providers:   https://pope.com/    This test is not yet approved or cleared by the [place] FDA and  has been authorized for detection and/or diagnosis of SARS-CoV-2 by FDA under an Emergency Use Authorization (EUA).  This EUA  will remain in effect (meaning th is test can be used) for the duration of  the COVID-19 declaration under Section 564(b)(1) of the Act, 21 U.S.C. section 360-bbb-3(b)(1), unless the authorization is terminated or revoked sooner.  Performed at [facility], [address] [place], [place] [address]    Time coordinating discharge: 35 minutes   SIGNED: Shon Hale, MD 04/11/2020    Shon Hale, MD  [facility] 04/11/2020, 1:37 PM How to contact the [facility] Attending or Consulting provider 7A - 7P or covering provider during after hours 7P -7A, for this patient?  1. Check the care team in [facility] and look for a) attending/consulting [facility] provider listed and b) the [facility] team listed 2. Log into [email or web address] and use [facility] universal password to access. If you do not have the password, please contact the hospital operator. 3. Locate the [facility] provider you are looking for under [facility] and page to a number that you can be directly reached. 4. If you still have difficulty reaching the provider, please page the [facility] (Director on Call) for the Hospitalists listed on amion for assistance.

## 2020-04-10 NOTE — Progress Notes (Signed)
Nephrology Follow-Up Consult note   Assessment/Recommendations: Clifford Hunt is a/an 59 y.o. male with a past medical history significant for alcohol use disorder, admitted for hyponatremia - found to have cirrhosis.     Hypotonic hyponatremia: Sodium 114 on arrival now stable around 126-127 for last couple of days.  Uosm of 157 and Una of 39. Liver disease favored as main contributor.  (Prior labs with Na of 130 and 135 in 2018).  Note he had also been on paxil.  TSH and cortisol not concerning - stopped paxil/SSRI after 10/2 dose and would not resume  - lasix 40 mg daily -  Given albumin prn this hosp -  continue spironolactone 25 mg daily  - Continue to limit free water intake to less than 1.8 L daily - earlier Chest x-ray with possible lung opacity; would consider CT scan outpt   Alcoholic Cirrhosis: AST and ALT mildly elevated related to alcohol use disorder.  INR normal.  Albumin low suggestive of some synthetic liver dysfunction.  Thrombocytopenia present.  Ultrasound suggestive of cirrhosis likely related to alcohol.   - spironolactone and lasix as above -Would benefit from GI/hepatology involvement in the outpatient setting   Lung opacity: Consider CT scan as above; outpt is reasonable  Depression - stopped paxil and would not resume; alternate regimen per primary team   Hypomagnesemia: OK today  Hypokalemia: on aldactone and has been repleted pretty much daily but approaching normal  Bicytopenia/pancytopenia: Anemia and thrombocytopenia with intermittently low white blood cell count.  Most likely related to alcohol use disorder causing bone marrow suppression and cirrhosis contributing. Vit B 12 normal. On folate.      Disposition:  Think we have on a good oral drug regimen.  Would not expect sodium to get that much higher-  130 is probably the ceiling-  Think would be OK for OP management if other issues are solved -  Mag and K likely nutritionally related but daily  lasix will not help -  Will likely needs supps as OP-  Planning for SNF upon discharge.  Renal will sign off, call with questions   ___________________________________________________________   S-   Not c/o anything-  Looks brighter today-  UOP not well recorded  Medications:  Current Facility-Administered Medications  Medication Dose Route Frequency Provider Last Rate Last Admin  . acetaminophen (TYLENOL) tablet 650 mg  650 mg Oral Q6H PRN Zierle-Ghosh, Asia B, DO   650 mg at 04/09/20 2326  . alum & mag hydroxide-simeth (MAALOX/MYLANTA) 200-200-20 MG/5ML suspension 30 mL  30 mL Oral Q4H PRN Sherryll Burger, Pratik D, DO   30 mL at 04/04/20 1523  . ascorbic acid (VITAMIN C) tablet 500 mg  500 mg Oral BID Sherryll Burger, Pratik D, DO   500 mg at 04/09/20 2024  . chlordiazePOXIDE (LIBRIUM) capsule 5 mg  5 mg Oral TID PRN Laural Benes, Clanford L, MD   5 mg at 04/09/20 2330  . diphenhydrAMINE (BENADRYL) capsule 25-50 mg  25-50 mg Oral Q6H PRN Johnson, Clanford L, MD   25 mg at 04/08/20 1731  . feeding supplement (ENSURE ENLIVE) (ENSURE ENLIVE) liquid 237 mL  237 mL Oral BID BM Levie Heritage, DO   237 mL at 04/08/20 0944  . folic acid (FOLVITE) tablet 1 mg  1 mg Oral Daily Sherryll Burger, Pratik D, DO   1 mg at 04/09/20 0849  . furosemide (LASIX) tablet 40 mg  40 mg Oral Daily Estanislado Emms, MD   40 mg at 04/09/20 0849  .  MEDLINE mouth rinse  15 mL Mouth Rinse BID Levie Heritage, DO   15 mL at 04/09/20 2024  . metoprolol tartrate (LOPRESSOR) injection 10 mg  10 mg Intravenous Q6H PRN Sherryll Burger, Pratik D, DO      . multivitamin with minerals tablet 1 tablet  1 tablet Oral Daily Sherryll Burger, Pratik D, DO   1 tablet at 04/09/20 0848  . nicotine (NICODERM CQ - dosed in mg/24 hours) patch 14 mg  14 mg Transdermal Daily Levie Heritage, DO   14 mg at 04/09/20 0850  . ondansetron (ZOFRAN) tablet 4 mg  4 mg Oral Q6H PRN Levie Heritage, DO       Or  . ondansetron [facility]) injection 4 mg  4 mg Intravenous Q6H PRN Levie Heritage, DO   4 mg at  04/04/20 1411  . spironolactone (ALDACTONE) tablet 25 mg  25 mg Oral Daily Darnell Level, MD   25 mg at 04/09/20 0849  . thiamine tablet 100 mg  100 mg Oral Daily Levie Heritage, DO   100 mg at 04/09/20 0354   Or  . thiamine (B-1) injection 100 mg  100 mg Intravenous Daily Levie Heritage, DO   100 mg at 03/30/20 1341     Physical Exam: Vitals:   04/09/20 2036 04/10/20 0555  BP: 107/76 137/90  Pulse: 100 96  Resp: 20 20  Temp: 98.5 F (36.9 C) 98.7 F (37.1 C)  SpO2: 98% 95%   No intake/output data recorded.  Intake/Output Summary (Last 24 hours) at 04/10/2020 0915 Last data filed at 04/10/2020 0557 Gross per 24 hour  Intake 100 ml  Output 350 ml  Net -250 ml   Constitutional: Chronically ill-appearing, no distress   ENMT: ears and nose without scars or lesions, MMM CV: normal rate, trace edema in the bilateral lower extremities Respiratory:  Clear and unlabored on room air Gastrointestinal: soft, mild distention; nontender to palpation Skin: multiple excoriations of skin Psych: alert, judgement/insight appropriate, appropriate mood and affect Neuro - very somnolent today    Lab Results  Component Value Date   NA 127 (L) 04/10/2020   K 3.5 04/10/2020   CL 92 (L) 04/10/2020   CO2 27 04/10/2020   BUN 9 04/10/2020   CREATININE 0.45 (L) 04/10/2020   CALCIUM 8.0 (L) 04/10/2020   ALBUMIN 2.0 (L) 04/10/2020   PHOS 4.0 04/10/2020     Cecille Aver, MD 04/10/2020  9:15 AM

## 2020-04-10 NOTE — Progress Notes (Signed)
Per pt it is fine to give updates to sister [name].   Per sister, [name] would NOT like patient to be treated with Remdesivir if needed for COVID treatment.

## 2020-04-10 NOTE — Care Management Important Message (Signed)
Important Message  Patient Details  Name: Clifford Hunt MRN: [id number] Date of Birth: 10-23-1960   Medicare Important Message Given:  Yes     Corey Harold 04/10/2020, 12:20 PM

## 2020-04-10 NOTE — Progress Notes (Addendum)
  04/10/2020 2:53 PM  Notified of positive Covid test while being screened for transfer to SNF today.   Pt asymptomatic.  Tested negative on 9/24.   Placed in isolation.  CXR 10/3 was negative for infiltrate.  Follow clinically and find SNF that takes Covid patients.   Pt on room air pulse ox 95%.    HOLD DISCHARGE FOR NOW AS SNF NOT ACCEPTING.   Maryln Manuel MD

## 2020-04-11 ENCOUNTER — Encounter: Payer: Self-pay | Admitting: Internal Medicine

## 2020-04-11 DIAGNOSIS — U071 COVID-19: Secondary | ICD-10-CM | POA: Diagnosis not present

## 2020-04-11 MED ORDER — SODIUM CHLORIDE 0.9 % IV SOLN: INTRAVENOUS | Status: DC | PRN

## 2020-04-11 MED ORDER — DIPHENHYDRAMINE HCL 50 MG/ML IJ SOLN: 50.0000 mg | Freq: Once | INTRAMUSCULAR | Status: DC | PRN

## 2020-04-11 MED ORDER — METHYLPREDNISOLONE SODIUM SUCC 125 MG IJ SOLR: 125.0000 mg | Freq: Once | INTRAMUSCULAR | Status: DC | PRN

## 2020-04-11 MED ORDER — FAMOTIDINE IN NACL 20-0.9 MG/50ML-% IV SOLN: 20.0000 mg | Freq: Once | INTRAVENOUS | Status: DC | PRN

## 2020-04-11 MED ORDER — EPINEPHRINE 0.3 MG/0.3ML IJ SOAJ
0.3000 mg | Freq: Once | INTRAMUSCULAR | Status: DC | PRN
  Filled 2020-04-11: qty 0.6

## 2020-04-11 MED ORDER — ALBUTEROL SULFATE HFA 108 (90 BASE) MCG/ACT IN AERS: 2.0000 | INHALATION_SPRAY | Freq: Once | RESPIRATORY_TRACT | Status: DC | PRN

## 2020-04-11 MED ORDER — SODIUM CHLORIDE 0.9 % IV SOLN
1200.0000 mg | Freq: Once | INTRAVENOUS | Status: AC
  Administered 2020-04-11: 1200 mg via INTRAVENOUS
  Filled 2020-04-11: qty 1200

## 2020-04-11 NOTE — TOC Transition Note (Signed)
Transition of Care [facility]) - CM/SW Discharge Note   Patient Details  Name: Ayad Nieman MRN: [id number] Date of Birth: 21-Oct-1960  Transition of Care [facility]) CM/SW Contact:  Annice Needy, LCSW Phone Number: 04/11/2020, 1:42 PM   Clinical Narrative:    Discharge clinicals sent to the facility. TOC signing off.    Final next level of care: Skilled Nursing Facility Barriers to Discharge: No Barriers Identified   Patient Goals and CMS Choice Patient states their goals for this hospitalization and ongoing recovery are:: to go to SNF. CMS Medicare.gov Compare Post Acute Care list provided to:: Patient Represenative (must comment) Choice offered to / list presented to : Sibling  Discharge Placement              Patient [facility] bed at: [facility] Patient to be transferred to facility by: [facility] Name of family member notified: Georgena Spurling Patient and family notified of of transfer: 04/11/20  Discharge Plan and Services                                     Social Determinants of Health (SDOH) Interventions     Readmission Risk Interventions No flowsheet data found.

## 2020-04-11 NOTE — Progress Notes (Addendum)
PROGRESS NOTE    Clifford Hunt  BOF:[id number] DOB: 10/18/1960 DOA: 03/30/2020 PCP: [facility]   Brief Narrative:  Per HPI: Clifford Hunt a 59 y.o.malewith a history of alcoholismwith 5-6 beers a day with little to no oral food intake. Patient was seen at [facility] and had blood work, which showed hyponatremia. The patient was called by his PCPs office and instructed to come to the emergency room for evaluation. He reports feeling weak with little to no energy. No palliating or provoking factors. His last alcohol consumption was earlier today: He had 2 beers prior to getting up. He does report some nausea and lack of appetite. He does have a longstanding history of foods not tasting right, which affects his appetite. His symptoms are unchanged. He does bruise easily, especially in his arms and hands. He has no swelling in his legs.  9/25:Patient was admitted with acute hyponatremia with serum sodium of 114 in the setting of beer [place] mania. This was noted on blood work as noted above. He was noted to have some nausea and change in appetite. His sodium levels have improved to 122 this morning and further IV fluid will be held and he will be maintained on fluid restriction for now with repeat labs in a.m. 2D echocardiogram ordered and pending. Potassium will be supplemented.  9/26:Patient continues to have some weakness but is more awake and alert today. His serum sodium remained stable at 121. Plan to restart IV normal saline. 2D echocardiogram pending. Right upper quadrant ultrasound with findings of liver cirrhosis and gallbladder sludge and stones noted. He appears to be tolerating diet. He will require PT evaluation and likely need placement to SNF/rehab.  9/27: Patient continues to remain weak with sodium levels of 123 noted today.  Plan to continue IV normal saline throughout the day at higher rate and recheck in  a.m.  Replete magnesium.  PT evaluation pending.  9/28: PT has assessed patient with recommendation for SNF noted.  His sodium levels are at 124 today.  Reiterated to nursing staff to obtain urine studies.  Discussed with nephrology regarding possibility for hypertonic saline and they have recommended continuing with normal saline for now until further studies return.  Urine protein/creatinine ratio will also be obtained to further analyze proteinuria.  9/29: sodium unchanged. Nephrology consultation.    9/30: sodium slightly improved.  Diuretics started by nephrology team.  10/2: sodium 126.    10/4: sodium 126.  Insurance auth expired, TOC resent clinicals for insurance auth for SNF today  04/11/20-- --- positive Covid test while being screened for transfer to SNF today on 04/10/20 Pt asymptomatic from Covid standpoint.  Tested negative on admission to the Hospital on 03/30/20....   Placed in isolation.  CXR 04/08/20 was negative for infiltrate.    Pt on room air pulse ox 95% -Received Regeneron monoclonal antibody on 04/11/2020 strongly advised to  to isolate/quarantine for at least 21 days from the date of  diagnoses with COVID-19 infection--please always wear a mask if you have to go outside the house    04/11/20--please see full discharge summary dictated by Dr. Standley Dakins on 04/10/2020, updated and addended by Dr. Mariea Clonts on 04/11/2020  Assessment & Plan:   Principal Problem:   Acute hyponatremia Active Problems:   COVID-19 virus infection-asymptomatic/received monoclonal antibody   Elevated LFTs   Alcohol abuse   Anemia, iron deficiency  Hyponatremia acute versus chronic-stable and slowly improving -Likely related tobeer potomania and  poor solute intake -completed IV normal saline and now discontinued as patient is euvolemic -appreciate nephrology consultation -sodium holding stable at 124-126 range.  He remains asymptomatic.  -Continue diuretics per nephrology team.    History of alcohol abuse -Continue alcohol withdrawal protocol with Librium  -Patient has been reluctant to pursue alcohol treatment program in the past.  Transaminitis-stable -Likely secondary to alcohol abusewith noted cirrhosis on ultrasound 9/25  Abnormal CXR -opacity seen possibly focus of pneumonia, repeat CXR 04/08/20 was negative for infiltrate after diuresis--suggesting this was volume related rather than pneumonia  Adverse reaction to albumin infusion - nephrology ordered albumin 10/3.  Pt starting having adverse reaction with hives and pruritus.  He was given benadryl and symptoms seem to be better. He also had a fever and sinus tachy.     Proteinuria -Hepatitis and HIV studies negative -Urine protein creatinine ratio ordered per nephrology request for further evaluation  Mild hypokalemia -Repleted.   Hypomagnesemia - replaced  Liver cirrhosis - diuretics started 9/30 by nephrology team -outpatient GI follow up recommended. -alcohol cessation advised  Anemia/thrombocytopenia-stable -Likely secondary to poor nutrition and ongoing chronic alcohol abuse -Anemia panel orderedwith no acute findings -Follow  Ongoing tobacco abuse -Nicotine patch -Counseled on cessation  Ambulatory dysfunction -SNF rehab  04/11/20--- positive Covid test while being screened for transfer to SNF today on 04/10/20 Pt asymptomatic from Covid standpoint.  Tested negative on admission to the Hospital on 03/30/20....   Placed in isolation.  CXR 04/08/20 was negative for infiltrate.    Pt on room air pulse ox 95% -Received Regeneron monoclonal antibody on 04/11/2020  Code Status:Full Family Communication:Discussed with fianc on phone 04/11/20 Disposition Plan:SNF  -04/11/20--please see full discharge summary dictated by Dr. Standley Dakinslanford Johnson on 04/10/2020, updated and addended by Dr. Mariea ClontsEmokpae on 04/11/2020  Dispo: The patient is from:Home Anticipated d/c is  to:SNF      Consultants:  Nephrology service/Dr. Valentino NosePeeples  Procedures:  See below  Antimicrobials:   None   Subjective:  -04/11/20--- positive Covid test while being screened for transfer to SNF today on 04/10/20 Pt asymptomatic from Covid standpoint.  Tested negative on admission to the Hospital on 03/30/20....   Placed in isolation.  CXR 04/08/20 was negative for infiltrate.    Pt on room air pulse ox 95% -Received Regeneron monoclonal antibody on 04/11/2020  -04/11/20--please see full discharge summary dictated by Dr. Standley Dakinslanford Johnson on 04/10/2020, updated and addended by Dr. Mariea ClontsEmokpae on 04/11/2020   Objective: Vitals:   04/10/20 2040 04/10/20 2300 04/11/20 0700 04/11/20 1211  BP:  115/86 98/75 104/71  Pulse:  (!) 103 (!) 108 (!) 103  Resp:  18 18 18   Temp:  99.3 F (37.4 C) 97.8 F (36.6 C) 98.2 F (36.8 C)  TempSrc:  Oral Oral Oral  SpO2: 91% 100% 92% 95%  Weight:   63.3 kg   Height:        Intake/Output Summary (Last 24 hours) at 04/11/2020 1335 Last data filed at 04/11/2020 0700 Gross per 24 hour  Intake 600 ml  Output 900 ml  Net -300 ml   Filed Weights   04/09/20 0514 04/10/20 0555 04/11/20 0700  Weight: 69.5 kg 69.4 kg 63.3 kg    Examination:  General exam: chronically ill appearing male, Appears weak but is awake and conversant.  Respiratory system: Fair air movement bilaterally, no respiratory distress, no wheezing  cardiovascular system: normal S1 & S2 heard.  Gastrointestinal system: Abdomen is nondistended, soft and nontender.  Central nervous system:  Alert and awake, generalized weakness without new focal deficits Extremities: No edema Skin: No rashes, lesions or ulcers Psychiatry: Appropriate affect,  Data Reviewed:   CBC: Recent Labs  Lab 04/09/20 0620  WBC 3.8*  HGB 9.1*  HCT 25.8*  MCV 106.6*  PLT 159   Basic Metabolic Panel: Recent Labs  Lab 04/06/20 0400 04/07/20 0655 04/08/20 0604 04/09/20 0620 04/10/20 0445  NA  124* 126* 124* 126* 127*  K 3.5 3.4* 3.2* 3.4* 3.5  CL 95* 94* 91* 90* 92*  CO2 21* 25 26 26 27   GLUCOSE 98 102* 129* 104* 101*  BUN 8 9 8 7 9   CREATININE 0.40* 0.42* 0.40* 0.44* 0.45*  CALCIUM 7.7* 7.8* 7.8* 8.0* 8.0*  MG 1.8 1.5* 1.9 1.6* 2.0  PHOS  --   --   --   --  4.0   GFR: Estimated Creatinine Clearance: 90.1 mL/min (A) (by C-G formula based on SCr of 0.45 mg/dL (L)). Liver Function Tests: Recent Labs  Lab 04/10/20 0445  ALBUMIN 2.0*   No results for input(s): LIPASE, AMYLASE in the last 168 hours. No results for input(s): AMMONIA in the last 168 hours. Coagulation Profile: Recent Labs  Lab 04/05/20 0707  INR 1.1   Cardiac Enzymes: No results for input(s): CKTOTAL, CKMB, CKMBINDEX, TROPONINI in the last 168 hours. BNP (last 3 results) No results for input(s): PROBNP in the last 8760 hours. HbA1C: No results for input(s): HGBA1C in the last 72 hours. CBG: Recent Labs  Lab 04/04/20 1710  GLUCAP 135*   Lipid Profile: No results for input(s): CHOL, HDL, LDLCALC, TRIG, CHOLHDL, LDLDIRECT in the last 72 hours. Thyroid Function Tests: No results for input(s): TSH, T4TOTAL, FREET4, T3FREE, THYROIDAB in the last 72 hours. Anemia Panel: No results for input(s): VITAMINB12, FOLATE, FERRITIN, TIBC, IRON, RETICCTPCT in the last 72 hours. Sepsis Labs: No results for input(s): PROCALCITON, LATICACIDVEN in the last 168 hours.  Recent Results (from the past 240 hour(s))  Culture, blood (Routine X 2) w Reflex to ID Panel     Status: None (Preliminary result)   Collection Time: 04/09/20  8:34 AM   Specimen: BLOOD LEFT HAND  Result Value Ref Range Status   Specimen Description BLOOD LEFT HAND  Final   Special Requests   Final    BOTTLES DRAWN AEROBIC AND ANAEROBIC Blood Culture adequate volume   Culture   Final    NO GROWTH 2 DAYS Performed at [facility], [address] [place], [address] [place]    Report Status PENDING  Incomplete  Culture, blood (Routine X 2) w  Reflex to ID Panel     Status: None (Preliminary result)   Collection Time: 04/09/20  8:34 AM   Specimen: BLOOD RIGHT HAND  Result Value Ref Range Status   Specimen Description BLOOD RIGHT HAND  Final   Special Requests   Final    BOTTLES DRAWN AEROBIC AND ANAEROBIC Blood Culture adequate volume   Culture   Final    NO GROWTH 2 DAYS Performed at [facility], [address] [place], [address] [place]    Report Status PENDING  Incomplete  SARS Coronavirus 2 by RT PCR (hospital order, performed in [facility] hospital lab) Nasopharyngeal Nasopharyngeal Swab     Status: Abnormal   Collection Time: 04/10/20 11:15 AM   Specimen: Nasopharyngeal Swab  Result Value Ref Range Status   SARS Coronavirus 2 POSITIVE (A) NEGATIVE Final    Comment: RESULT CALLED TO, READ BACK BY AND VERIFIED WITH:  CRYSTAL REYNOLDS @1419  ON 04/10/20 BY TJ (NOTE) SARS-CoV-2 target nucleic acids are DETECTED  SARS-CoV-2 RNA is generally detectable in upper respiratory specimens  during the acute phase of infection.  Positive results are indicative  of the presence of the identified virus, but do not rule out bacterial infection or co-infection with other pathogens not detected by the test.  Clinical correlation with patient history and  other diagnostic information is necessary to determine patient infection status.  The expected result is negative.  Fact Sheet for Patients:   06/10/20   Fact Sheet for Healthcare Providers:   BoilerBrush.com.cy    This test is not yet approved or cleared by the https://pope.com/ FDA and  has been authorized for detection and/or diagnosis of SARS-CoV-2 by FDA under an Emergency Use Authorization (EUA).  This EUA will remain in effect (meaning th is test can be used) for the duration of  the COVID-19 declaration under Section 564(b)(1) of the Act, 21 U.S.C. section 360-bbb-3(b)(1), unless the authorization is terminated or  revoked sooner.  Performed at [facility], [address] [place], [place]    Radiology Studies: No results found.  Scheduled Meds: . vitamin C  500 mg Oral BID  . feeding supplement (ENSURE ENLIVE)  237 mL Oral BID BM  . folic acid  1 mg Oral Daily  . furosemide  40 mg Oral Daily  . mouth rinse  15 mL Mouth Rinse BID  . multivitamin with minerals  1 tablet Oral Daily  . nicotine  14 mg Transdermal Daily  . potassium chloride  40 mEq Oral Once  . spironolactone  25 mg Oral Daily  . thiamine  100 mg Oral Daily   Or  . thiamine  100 mg Intravenous Daily   Continuous Infusions: . sodium chloride    . famotidine (PEPCID) IV      LOS: 12 days   04/11/20--please see full discharge summary dictated by Dr. 06/11/20 on 04/10/2020, updated and addended by Dr. 06/10/2020 on 04/11/2020  06/11/2020, MD  [facility]  If 7PM-7AM, please contact night-coverage [email or web address] 04/11/2020, 1:35 PM

## 2020-04-11 NOTE — Discharge Instructions (Signed)
You are strongly advised to  to isolate/quarantine for at least 21 days from the date of your diagnoses with COVID-19 infection--please always wear a mask if you have to go outside the house   Alcohol Abuse and Dependence Information, Adult Alcohol is a widely available drug. People drink alcohol in different amounts. People who drink alcohol very often and in large amounts often have problems during and after drinking. They may develop what is called an alcohol use disorder. There are two main types of alcohol use disorders:  Alcohol abuse. This is when you use alcohol too much or too often. You may use alcohol to make yourself feel happy or to reduce stress. You may have a hard time setting a limit on the amount you drink.  Alcohol dependence. This is when you use alcohol consistently for a period of time, and your body changes as a result. This can make it hard to stop drinking because you may start to feel sick or feel different when you do not use alcohol. These symptoms are known as withdrawal. How can alcohol abuse and dependence affect me? Alcohol abuse and dependence can have a negative effect on your life. Drinking too much can lead to addiction. You may feel like you need alcohol to function normally. You may drink alcohol before work in the morning, during the day, or as soon as you get home from work in the evening. These actions can result in:  Poor work performance.  Job loss.  Financial problems.  Car crashes or criminal charges from driving after drinking alcohol.  Problems in your relationships with friends and family.  Losing the trust and respect of coworkers, friends, and family. Drinking heavily over a long period of time can permanently damage your body and brain, and can cause lifelong health issues, such as:  Damage to your liver or pancreas.  Heart problems, high blood pressure, or stroke.  Certain cancers.  Decreased ability to fight infections.  Brain or  nerve damage.  Depression.  Early (premature) death. If you are careless or you crave alcohol, it is easy to drink more than your body can handle (overdose). Alcohol overdose is a serious situation that requires hospitalization. It may lead to permanent injuries or death. What can increase my risk?  Having a family history of alcohol abuse.  Having depression or other mental health conditions.  Beginning to drink at an early age.  Binge drinking often.  Experiencing trauma, stress, and an unstable home life during childhood.  Spending time with people who drink often. What actions can I take to prevent or manage alcohol abuse and dependence?  Do not drink alcohol if: ? Your health care provider tells you not to drink. ? You are pregnant, may be pregnant, or are planning to become pregnant.  If you drink alcohol: ? Limit how much you use to:  0-1 drink a day for women.  0-2 drinks a day for men. ? Be aware of how much alcohol is in your drink. In the [place] one drink equals one 12 oz bottle of beer (355 mL), one 5 oz glass of wine (148 mL), or one 1 oz glass of hard liquor (44 mL).  Stop drinking if you have been drinking too much. This can be very hard to do if you are used to abusing alcohol. If you begin to have withdrawal symptoms, talk with your health care provider or a person that you trust. These symptoms may include anxiety, shaky hands, headache,  nausea, sweating, or not being able to sleep.  Choose to drink nonalcoholic beverages in social gatherings and places where there may be alcohol. Activity  Spend more time on activities that you enjoy that do not involve alcohol, like hobbies or exercise.  Find healthy ways to cope with stress, such as exercise, meditation, or spending time with people you care about. General information  Talk to your family, coworkers, and friends about supporting you in your efforts to stop drinking. If they drink, ask them not to drink  around you. Spend more time with people who do not drink alcohol.  If you think that you have an alcohol dependency problem: ? Tell friends or family about your concerns. ? Talk with your health care provider or another health professional about where to get help. ? Work with a Paramedic and a Network engineer. ? Consider joining a support group for people who struggle with alcohol abuse and dependence. Where to find support   Your health care provider.  SMART Recovery: www.smartrecovery.org Therapy and support groups  Local treatment centers or chemical dependency counselors.  Local AA groups in your community: SalaryStart.tn Where to find more information  Centers for Disease Control and Prevention: FootballExhibition.com.br  [employer] on Alcohol Abuse and Alcoholism: BasicStudents.dk  Alcoholics Anonymous (AA): SalaryStart.tn Contact a health care provider if:  You drank more or for longer than you intended on more than one occasion.  You tried to stop drinking or to cut back on how much you drink, but you were not able to.  You often drink to the point of vomiting or passing out.  You want to drink so badly that you cannot think about anything else.  You have problems in your life due to drinking, but you continue to drink.  You keep drinking even though you feel anxious, depressed, or have experienced memory loss.  You have stopped doing the things you used to enjoy in order to drink.  You have to drink more than you used to in order to get the effect you want.  You experience anxiety, sweating, nausea, shakiness, and trouble sleeping when you try to stop drinking. Get help right away if:  You have thoughts about hurting yourself or others.  You have serious withdrawal symptoms, including: ? Confusion. ? Racing heart. ? High blood pressure. ? Fever. If you ever feel like you may hurt yourself or others, or have thoughts about taking your own life, get help  right away. You can go to your nearest emergency department or call:  Your local emergency services (911 in the [place]  A suicide crisis helpline, such as the National Suicide Prevention Lifeline at 337-232-4172. This is open 24 hours a day. Summary  Alcohol abuse and dependence can have a negative effect on your life. Drinking too much or too often can lead to addiction.  If you drink alcohol, limit how much you use.  If you are having trouble keeping your drinking under control, find ways to change your behavior. Hobbies, calming activities, exercise, or support groups can help.  If you feel you need help with changing your drinking habits, talk with your health care provider, a good friend, or a therapist, or go to an AA group. This information is not intended to replace advice given to you by your health care provider. Make sure you discuss any questions you have with your health care provider. Document Revised: 10/12/2018 Document Reviewed: 08/31/2018 Elsevier Patient Education  2020 [employer].  Alcoholic Liver Disease Alcoholic liver disease refers to liver damage that is caused by drinking a lot of alcohol over a long period of time. The liver is an organ that:  Helps to divide (break down) and absorb fats and other nutrients in the blood.  Helps to remove harmful substances from the blood.  Makes the parts of the blood that help to form clots and prevent excessive bleeding. Damage may cause the liver to stop working properly. There are three main types of alcoholic liver disease, and they usually occur in the following order. 1. Fatty liver disease. This is considered the early stage of alcoholic liver disease. It is a condition in which too much fat has built up in the liver cells. In many cases, fatty liver disease does not cause any symptoms. It is often diagnosed when lab tests are done for other reasons. 2. Alcoholic hepatitis. This is liver inflammation that  decreases the liver's ability to function normally. 3. Alcoholic cirrhosis. Cirrhosis is long-term (chronic) liver injury. Having cirrhosis means that many healthy liver cells have been replaced by scar tissue. This prevents blood from flowing through the liver, which makes it difficult for the liver to function. Symptoms of this condition usually get worse (progress) over time if alcohol use continues. Treatment requires stopping all alcohol intake. What are the causes? Alcoholic liver disease is caused by long-term (chronic) heavy alcohol use. The liver filters alcohol out of the bloodstream. When alcohol gets broken down in the liver, it releases poisonous (toxic) chemicals that damage liver cells. What increases the risk? This condition is more likely to develop in:  Women.  People who have a family history of alcoholic liver disease.  People who have poor nutrition.  People who are obese.  People who drink a lot of alcohol, especially people who often drink a lot during short periods of time (binge drink).  People who have an underlying liver disease such as hepatitis B or hepatitis C.  People who lack certain nutrients, such as folate or thiamine (have nutrient deficiencies). What are the signs or symptoms? Most people do not have symptoms in the early stages of this disease. If early symptoms do develop, they may include:  Loss of appetite.  Nausea and vomiting. Symptoms of moderate disease include:  Loss of appetite.  Nausea and vomiting.  Diarrhea.  Weight loss without trying.  Fatigue.  Yellowing of the skin and the whites of the eyes (jaundice).  Pain and swelling in the abdomen.  Fever. Symptoms of advanced disease include:  Weight loss and muscle loss.  Itchy skin.  Buildup of fluid in the abdomen (ascites).  Swelling of the feet and ankles (edema).  Nosebleeds or bleeding gums.  Bloody bowel movements.  Enlarged fingertips  (clubbing).  Trouble concentrating.  Trouble sleeping.  Mood changes.  Agitation.  Confusion. Some people do not have symptoms until the condition becomes severe. Symptoms often get worse right after a period of heavy drinking. How is this diagnosed? This condition may be diagnosed based on:  A physical exam.  Blood tests.  Tests that create detailed images of the body. These may include: ? Liver ultrasound. ? CT scan. ? MRI.  A liver biopsy. For this test, a small sample of liver tissue is removed and checked for signs of damage. How is this treated? The most important part of treatment is to stop drinking alcohol. If you are addicted to alcohol, your health care provider will help you make a plan  to quit. This plan may involve:  Taking medicine to decrease unpleasant symptoms that are caused by stopping or decreasing alcohol use (withdrawal symptoms).  Entering a treatment program to help you stop drinking.  Joining a support group. Treatment for alcoholic liver disease may also include:  Nutritional therapy. Your health care provider or a diet and nutrition specialist (dietitian) may recommend: ? Eating a healthy diet. ? Taking vitamins. ? Eating foods that contain a lot of zinc or B vitamins such as folate, thiamine, or pyridoxine.  Steroid medicines to reduce liver inflammation. These may be recommended if your disease is severe.  Receiving a donated liver (liver transplant). This is only done in very severe cases, and only for people who have completely stopped drinking and can commit to never drinking alcohol again. Follow these instructions at home:   Do not drink alcohol. Follow your treatment plan, and work with your health care provider as needed.  Consider joining an alcohol support group. These groups can provide emotional support and guidance.  Take over-the-counter and prescription medicines only as told by your health care provider. These include  vitamins and supplements.  Do not use medicines or eat foods that contain alcohol unless told by your health care provider.  Follow instructions from your health care provider or dietitian about eating a healthy diet.  Keep all follow-up visits as told by your health care provider. This is important. Contact a health care provider if:  You develop a fever.  Your skin color becomes more yellow, pale, or dark.  You develop headaches. Get help right away if:  You vomit blood.  You have bright red blood in your stool.  You have black, tarry stools.  You have trouble: ? Thinking. ? Walking. ? Balancing. ? Breathing. Summary  Alcoholic liver disease refers to liver damage that is caused by drinking a lot of alcohol over a long period of time.  Symptoms of this condition usually get worse (progress) over time if alcohol use continues.  Your health care provider will do a physical exam and tests to diagnose your condition.  The most important part of treatment is to stop drinking alcohol. Follow your treatment plan, and work with your health care provider as needed. This information is not intended to replace advice given to you by your health care provider. Make sure you discuss any questions you have with your health care provider. Document Revised: 10/12/2018 Document Reviewed: 03/05/2017 Elsevier Patient Education  2020 [employer]

## 2020-04-14 LAB — CULTURE, BLOOD (ROUTINE X 2)
Culture: NO GROWTH
Culture: NO GROWTH
Special Requests: ADEQUATE
Special Requests: ADEQUATE

## 2020-05-11 ENCOUNTER — Emergency Department (HOSPITAL_COMMUNITY): Payer: Medicare PPO

## 2020-05-11 ENCOUNTER — Other Ambulatory Visit: Payer: Self-pay

## 2020-05-11 ENCOUNTER — Encounter (HOSPITAL_COMMUNITY): Payer: Self-pay | Admitting: *Deleted

## 2020-05-11 ENCOUNTER — Emergency Department (HOSPITAL_COMMUNITY)
Admission: EM | Admit: 2020-05-11 | Discharge: 2020-05-12 | Disposition: A | Discharge status: Home / Self Care | Payer: Medicare PPO | Attending: Emergency Medicine | Admitting: Emergency Medicine

## 2020-05-11 DIAGNOSIS — L03119 Cellulitis of unspecified part of limb: Secondary | ICD-10-CM

## 2020-05-11 DIAGNOSIS — L03116 Cellulitis of left lower limb: Secondary | ICD-10-CM | POA: Diagnosis not present

## 2020-05-11 DIAGNOSIS — R410 Disorientation, unspecified: Secondary | ICD-10-CM | POA: Diagnosis not present

## 2020-05-11 DIAGNOSIS — R2243 Localized swelling, mass and lump, lower limb, bilateral: Secondary | ICD-10-CM | POA: Diagnosis present

## 2020-05-11 DIAGNOSIS — Z8616 Personal history of COVID-19: Secondary | ICD-10-CM | POA: Insufficient documentation

## 2020-05-11 DIAGNOSIS — L03115 Cellulitis of right lower limb: Secondary | ICD-10-CM | POA: Diagnosis not present

## 2020-05-11 DIAGNOSIS — Z79899 Other long term (current) drug therapy: Secondary | ICD-10-CM | POA: Diagnosis not present

## 2020-05-11 DIAGNOSIS — F17210 Nicotine dependence, cigarettes, uncomplicated: Secondary | ICD-10-CM | POA: Insufficient documentation

## 2020-05-11 DIAGNOSIS — R600 Localized edema: Secondary | ICD-10-CM | POA: Diagnosis not present

## 2020-05-11 DIAGNOSIS — I10 Essential (primary) hypertension: Secondary | ICD-10-CM | POA: Insufficient documentation

## 2020-05-11 DIAGNOSIS — K7031 Alcoholic cirrhosis of liver with ascites: Secondary | ICD-10-CM | POA: Diagnosis not present

## 2020-05-11 NOTE — ED Triage Notes (Signed)
C/o swelling all over for weeks

## 2020-05-11 NOTE — ED Notes (Signed)
EKG done and seen by EDP. Patient on cardiac monitor at this time

## 2020-05-12 ENCOUNTER — Emergency Department (HOSPITAL_COMMUNITY): Payer: Medicare PPO

## 2020-05-12 LAB — COMPREHENSIVE METABOLIC PANEL
ALT: 20 U/L (ref 0–44)
AST: 31 U/L (ref 15–41)
Albumin: 2.4 g/dL — ABNORMAL LOW (ref 3.5–5.0)
Alkaline Phosphatase: 80 U/L (ref 38–126)
Anion gap: 9 (ref 5–15)
BUN: 7 mg/dL (ref 6–20)
CO2: 23 mmol/L (ref 22–32)
Calcium: 8.1 mg/dL — ABNORMAL LOW (ref 8.9–10.3)
Chloride: 99 mmol/L (ref 98–111)
Creatinine, Ser: 0.67 mg/dL (ref 0.61–1.24)
GFR, Estimated: >60 mL/min (ref >60)
Glucose, Bld: 100 mg/dL — ABNORMAL HIGH (ref 70–99)
Potassium: 4.3 mmol/L (ref 3.5–5.1)
Sodium: 131 mmol/L — ABNORMAL LOW (ref 135–145)
Total Bilirubin: 0.7 mg/dL (ref 0.3–1.2)
Total Protein: 5.9 g/dL — ABNORMAL LOW (ref 6.5–8.1)

## 2020-05-12 LAB — PROTIME-INR
INR: 1.1 (ref 0.8–1.2)
Prothrombin Time: 13.8 seconds (ref 11.4–15.2)

## 2020-05-12 LAB — AMMONIA: Ammonia: 23 umol/L (ref 9–35)

## 2020-05-12 LAB — CBC WITH DIFFERENTIAL/PLATELET
Abs Immature Granulocytes: 0.04 10*3/uL (ref 0.00–0.07)
Basophils Absolute: 0 10*3/uL (ref 0.0–0.1)
Basophils Relative: 0 %
Eosinophils Absolute: 0.1 10*3/uL (ref 0.0–0.5)
Eosinophils Relative: 2 %
HCT: 28.3 % — ABNORMAL LOW (ref 39.0–52.0)
Hemoglobin: 9.2 g/dL — ABNORMAL LOW (ref 13.0–17.0)
Immature Granulocytes: 1 %
Lymphocytes Relative: 15 %
Lymphs Abs: 0.7 10*3/uL (ref 0.7–4.0)
MCH: 35.7 pg — ABNORMAL HIGH (ref 26.0–34.0)
MCHC: 32.5 g/dL (ref 30.0–36.0)
MCV: 109.7 fL — ABNORMAL HIGH (ref 80.0–100.0)
Monocytes Absolute: 0.7 10*3/uL (ref 0.1–1.0)
Monocytes Relative: 15 %
Neutro Abs: 3 10*3/uL (ref 1.7–7.7)
Neutrophils Relative %: 67 %
Platelets: 246 10*3/uL (ref 150–400)
RBC: 2.58 MIL/uL — ABNORMAL LOW (ref 4.22–5.81)
RDW: 13.6 % (ref 11.5–15.5)
WBC: 4.5 10*3/uL (ref 4.0–10.5)
nRBC: 0 % (ref 0.0–0.2)

## 2020-05-12 LAB — BRAIN NATRIURETIC PEPTIDE: B Natriuretic Peptide: 111 pg/mL — ABNORMAL HIGH (ref 0.0–100.0)

## 2020-05-12 LAB — APTT: aPTT: 31 seconds (ref 24–36)

## 2020-05-12 LAB — ETHANOL: Alcohol, Ethyl (B): <10 mg/dL (ref <10)

## 2020-05-12 MED ORDER — FUROSEMIDE 40 MG PO TABS: 40.0000 mg | ORAL_TABLET | Freq: Every day | ORAL | 0 refills | Status: DC | PRN

## 2020-05-12 MED ORDER — CEPHALEXIN 500 MG PO CAPS: 500.0000 mg | ORAL_CAPSULE | Freq: Three times a day (TID) | ORAL | 0 refills | Status: DC

## 2020-05-12 MED ORDER — NICOTINE 14 MG/24HR TD PT24
14.0000 mg | MEDICATED_PATCH | Freq: Once | TRANSDERMAL | Status: DC
  Administered 2020-05-12: 14 mg via TRANSDERMAL
  Filled 2020-05-12: qty 1

## 2020-05-12 MED ORDER — CEPHALEXIN 500 MG PO CAPS
500.0000 mg | ORAL_CAPSULE | Freq: Once | ORAL | Status: AC
  Administered 2020-05-12: 500 mg via ORAL
  Filled 2020-05-12: qty 1

## 2020-05-12 NOTE — Discharge Instructions (Addendum)
Elevate your legs.  Please look at the low-sodium diet, if you eat too much salt you will have more swelling.  Unfortunately that includes [employer] [place] fries.  For the next several days take an extra dose of your Lasix to help get the swelling down.  Take the antibiotics to prevent infection in your legs.  You need to stop drinking the [employer]'douls nonalcoholic beer.  In actuality it does have alcohol and it at 0.4%.  Please have your doctors check you this week.  If you get worse return to the hospital.

## 2020-05-12 NOTE — ED Notes (Signed)
Patient discharged via wheelchair home to caregiver.

## 2020-05-12 NOTE — ED Notes (Signed)
Per significant other, patient has been frequently hallucinating.  She states he has been wandering the house at night and "making a mess."  Significant other spoke to hospitalist to voice her concerns regarding patient's recent behavior.

## 2020-05-12 NOTE — ED Notes (Signed)
Patient was Covid positive on 04/10/2020. Per current guidelines, it is not advised to retest patient within 90 days of a positive result.

## 2020-05-12 NOTE — Progress Notes (Signed)
I was called to admit patient by the ED physician. Patient is a 59 y.o.malewith a history of liver cirrhosis, alcoholismwith 5-6 beers a day who was admitted on 9/24 and discharged on 10/6 to a SNF due to hyponatremia (acute versus stable).  Patient has since went through a detox program and endorsed drinking 6 beers of O'douls beer daily.  He presented to the ED today accompanied by significant other due to bilateral leg swelling, tremors (which patient states has improved slightly since last admission), hallucination and recurrent falls at home. At bedside, an erythematous rash was noted on left shin (suspected to be due to cellulitis), both legs were reported to have been wrapped by his PCP on Wednesday (11/3), but patient took off the bandage due to pain on the legs, this was bandaged again by home health on the following day (Thursday, 11/4), but patient also took the bandage off on Friday (11/5). Reviewing patient's labs, it was noted to have had consistent elevated MCV ranging from 106.6-113 (MCV today 109.7), considering patient's persistent tremors and imbalance with increased falls, it would be reasonable to check vitamin B12 level and folate level.  However, patient refused being admitted and states that he will return during the day if symptoms gets worse. This was discussed with ED physician (Dr. Lynelle Doctor) and it was advised that patient should be prescribed with antibiotics for probable left lower extremity cellulitis prior to discharge from the ED.  Discussion was also held regarding adjusting patient's dose for Lasix and spironolactone due to concern of bilateral leg swelling. Ultrasound of abdomen will be reasonable if patient changes his mind on staying due to mild abdominal distention with suspicion for possible ascites.

## 2020-05-12 NOTE — ED Notes (Signed)
Requested patient provide urine specimen if able.  Urinal provided to bedside.  Patient began rambling about marbles and offenders in the prison system.

## 2020-05-12 NOTE — ED Notes (Signed)
Patient transported to CT 
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
## 2020-05-12 NOTE — ED Notes (Signed)
Received care of patient via wheelchair from lobby.  Patient requiring 2 person extensive assistance with transferring from wheelchair to bed.  Required verbal cueing.

## 2020-05-23 LAB — PROTIME-INR: INR: 1 (ref 0.9–1.1)

## 2020-06-04 ENCOUNTER — Other Ambulatory Visit: Payer: Self-pay

## 2020-06-04 ENCOUNTER — Ambulatory Visit: Payer: Medicare PPO | Admitting: Gastroenterology

## 2020-06-04 ENCOUNTER — Encounter: Payer: Self-pay | Admitting: Gastroenterology

## 2020-06-04 DIAGNOSIS — K7031 Alcoholic cirrhosis of liver with ascites: Secondary | ICD-10-CM

## 2020-06-04 DIAGNOSIS — K7460 Unspecified cirrhosis of liver: Secondary | ICD-10-CM | POA: Insufficient documentation

## 2020-06-04 NOTE — Progress Notes (Signed)
Primary Care Physician:  [facility] Referring provider: [facility]ist: Dr. Irwin Brakeman Primary Gastroenterologist:  Garfield Cornea, MD   Chief Complaint  Patient presents with  . Cirrhosis    HPI:  Clifford Hunt is a 59 y.o. male for further evaluation of cirrhosis.  He was hospitalized September 24 through October 6, at time of discharge he was asked to follow-up with GI for cirrhosis.  Back in 03/2020 patient was hospitalized initially due to hyponatremia discovered as an outpatient. Na on presentation was 114.  He had reported feeling weak and having little energy.  History of alcoholism with 8-10 beers (sometimes up to 24 beers) a day for over 20 years. Work up revealed cirrhosis.  Patient was covid negative at time of admission. Prior to being discharged to SNF he tested positive for covid and received Regeneron monoclonal antibody on April 11, 2020. He stayed for rehab for about 2 weeks.Abdominal ultrasound April 04, 2020 showed sludge within the gallbladder.  No gallbladder wall thickening.  CBD 5 mm.  Appearance of the liver indicative of hepatic cirrhosis, no focal liver lesions.  Moderate ascites. Portal vein patent.  Spleen normal in size. INR remained normal during hospitalization. Sodium at discharge was 127. His albumin was in the 2 range. AST/ALT slightly elevated most consistent with alcoholic hepatitis. Acute viral hepatitis panel was negative. Did have some thrombocytopenia during his hospitalization which resolved at time of discharge. Noted to have macrocytic anemia as well. B12 and folate normal. Ferritin was 1418 (unclear how much of this was related to Covid and also likely elevated due to alcohol abuse).  Seen in the ED on November 6 with bilateral leg swelling/cellulitis, tremors, hallucinations and recurrent falls at home.  Lasix was increased for a couple of days, started on antibiotics.  Followed up with PCP. Labs at that time with  hemoglobin of 9.2, hematocrit 28.3, MCV 109.7, platelets 246,000, sodium 131, BUN 7, creatinine 0.67, albumin 2.4, LFTs otherwise normal. INR normal.  Patient currently on Lasix 40 mg daily, spironolactone 100 mg daily.  States his lower extremity edema is doing much better.  Previously could not wear shoes.  Used to have swelling up to his groin.  He has noticed that he has lost some muscle mass in the upper torso.  Was not noticeable until he lost all of his fluid.  His appetite is fair.  He is trying to do better eating. When he was actively drinking he would go days without food.  He is drinking Ensure 1-2 times daily. No abdominal pain. BM regular. No melena, brbpr. No n/v. No heartburn. He is having issues with cataracts on his right eye.  Surgery pending.  States he has had some equilibrium issues related to this.  Prone to falling.  Has fallen several times.  Head CT on November 6 showed stable appearance of remote cortically based right frontal lobe encephalomalacia.  Patient continues to non-alcoholic beer, [employer], 5-6 per day. One beer has 0.4% alcohol.   EGD/TCS in the past 2 years in [place].   Patient did not mention during the encounter today but under care everywhere records indicate that he was admitted at [facility] from November 7 through November 11 due to alcohol withdrawal syndrome, electrolyte abnormalities, possible Wernicke's. Had abdominal paracentesis 05/17/2019 through [facility], 4700 cc of clear yellow fluid removed. Cell count unremarkable. Cultures negative. Echocardiogram done November 11 at [facility] unremarkable. Labs from 05/17/2020 while inpatient showed hemoglobin of 7.9, hematocrit 24.4, platelets 157,000,  MCV 107, sodium 133, albumin 1.8. AST 28, ALT 15, total bilirubin 0.5, alk phos 59. Folate greater than 20, ferritin 319, iron 30, TIBC 185. B12 242.  Current Outpatient Medications  Medication Sig Dispense Refill  . buPROPion (WELLBUTRIN XL) 150 MG 24 hr tablet Take 150 mg by  mouth daily.    . feeding supplement, ENSURE ENLIVE, (ENSURE ENLIVE) LIQD Take 237 mLs by mouth 2 (two) times daily between meals. 237 mL 12  . ferrous sulfate 325 (65 FE) MG tablet Take 1 tablet by mouth daily.    . folic acid (FOLVITE) 1 MG tablet Take 1 tablet (1 mg total) by mouth daily.    . furosemide (LASIX) 40 MG tablet Take 1 tablet (40 mg total) by mouth daily. 30 tablet   . mirtazapine (REMERON) 15 MG tablet Take 1 tablet by mouth at bedtime.    . Multiple Vitamin (MULTIVITAMIN WITH MINERALS) TABS tablet Take 1 tablet by mouth daily.    . naltrexone (DEPADE) 50 MG tablet Take by mouth daily.    . pantoprazole (PROTONIX) 40 MG tablet Take 1 tablet by mouth daily.    . potassium chloride SA (KLOR-CON) 10 MEQ tablet Take 1 tablet (10 mEq total) by mouth daily.    Marland Kitchen spironolactone (ALDACTONE) 100 MG tablet Take 100 mg by mouth daily.    Marland Kitchen thiamine 100 MG tablet Take 1 tablet (100 mg total) by mouth daily.    . Vitamin D, Ergocalciferol, (DRISDOL) 1.25 MG (50000 UNIT) CAPS capsule Take 50,000 Units by mouth every 7 (seven) days.     No current facility-administered medications for this visit.    Allergies as of 06/04/2020 - Review Complete 06/04/2020  Allergen Reaction Noted  . Morphine and related Shortness Of Breath 02/18/2017  . Pork-derived products Hives 06/04/2020  . Albumin (human) Hives, Itching, and Palpitations 04/09/2020    Past Medical History:  Diagnosis Date  . Hypertension   . Liver damage     Past Surgical History:  Procedure Laterality Date  . COLONOSCOPY     [place] around 2019  . ESOPHAGOGASTRODUODENOSCOPY     [place] around 2019    Family History  Problem Relation Age of Onset  . Breast cancer Mother   . Heart attack Father   . Colon cancer Neg Hx   . Liver disease Neg Hx     Social History   Socioeconomic History  . Marital status: Significant Other    Spouse name: Not on file  . Number of children: Not on file  . Years of  education: Not on file  . Highest education level: Not on file  Occupational History  . Not on file  Tobacco Use  . Smoking status: Current Every Day Smoker    Packs/day: 1.00    Types: Cigarettes  . Smokeless tobacco: Never Used  . Tobacco comment: 1 pack/day  Substance and Sexual Activity  . Alcohol use: Not Currently    Comment: 12 pack/day; 06/04/20 denied, quit September 2021  . Drug use: No  . Sexual activity: Not on file  Other Topics Concern  . Not on file  Social History Narrative  . Not on file   Social Determinants of Health   Financial Resource Strain:   . Difficulty of Paying Living Expenses: Not on file  Food Insecurity:   . Worried About Charity fundraiser in the Last Year: Not on file  . Ran Out of Food in the Last Year: Not on file  Transportation Needs:   . Film/video editor (Medical): Not on file  . Lack of Transportation (Non-Medical): Not on file  Physical Activity:   . Days of Exercise per Week: Not on file  . Minutes of Exercise per Session: Not on file  Stress:   . Feeling of Stress : Not on file  Social Connections:   . Frequency of Communication with Friends and Family: Not on file  . Frequency of Social Gatherings with Friends and Family: Not on file  . Attends Religious Services: Not on file  . Active Member of Clubs or Organizations: Not on file  . Attends Archivist Meetings: Not on file  . Marital Status: Not on file  Intimate Partner Violence:   . Fear of Current or Ex-Partner: Not on file  . Emotionally Abused: Not on file  . Physically Abused: Not on file  . Sexually Abused: Not on file      ROS:  General: Negative for  fever, chills, fatigue. + weakness. See hpi Eyes: Negative for vision changes.  ENT: Negative for hoarseness, difficulty swallowing , nasal congestion. CV: Negative for chest pain, angina, palpitations, dyspnea on exertion, peripheral edema (resolved) Respiratory: Negative for dyspnea at rest,  dyspnea on exertion, cough, sputum, wheezing.  GI: See history of present illness. GU:  Negative for dysuria, hematuria, urinary incontinence, urinary frequency, nocturnal urination.  MS: Negative for joint pain, low back pain.  Derm: Negative for rash or itching.  Neuro: Negative for weakness, abnormal sensation, seizure, frequent headaches, memory loss, confusion.  Psych: Negative for anxiety, depression, suicidal ideation, hallucinations.  Endo: Negative for unusual weight change.  Heme: Negative for bruising or bleeding. Allergy: Negative for rash or hives.    Physical Examination:  BP 95/66   Pulse (!) 104   Temp (!) 96.8 F (36 C) (Temporal)   Ht 5' 10"  (1.778 m)   Wt 144 lb 12.8 oz (65.7 kg)   BMI 20.78 kg/m    General:chronically ill-appearing male in nad. Accompanied by significant other Head: Normocephalic, atraumatic.   Eyes: Conjunctiva pink, no icterus. Mouth: Oropharyngeal mucosa moist and pink , no lesions erythema or exudate. Neck: Supple without thyromegaly, masses, or lymphadenopathy.  Lungs: Clear to auscultation bilaterally.  Heart: Regular rate and rhythm, no murmurs rubs or gallops.  Abdomen: Bowel sounds are normal, nontender, nondistended, no hepatosplenomegaly or masses, no abdominal bruits or    hernia , no rebound or guarding.   Rectal: not performed Extremities: No lower extremity edema. No clubbing or deformities. Some scabs upper arms much improved from 05/12/20 ed pictures.  Neuro: Alert and oriented x 4 , grossly normal neurologically.  Skin: Warm and dry, no rash or jaundice.   Psych: Alert and cooperative, normal mood and affect.  Labs: Lab Results  Component Value Date   CREATININE 0.67 05/11/2020   BUN 7 05/11/2020   NA 131 (L) 05/11/2020   K 4.3 05/11/2020   CL 99 05/11/2020   CO2 23 05/11/2020   Lab Results  Component Value Date   ALT 20 05/11/2020   AST 31 05/11/2020   ALKPHOS 80 05/11/2020   BILITOT 0.7 05/11/2020   Lab  Results  Component Value Date   WBC 4.5 05/11/2020   HGB 9.2 (L) 05/11/2020   HCT 28.3 (L) 05/11/2020   MCV 109.7 (H) 05/11/2020   PLT 246 05/11/2020   Lab Results  Component Value Date   INR 1.1 05/11/2020   INR 1.1 04/05/2020   INR 1.1  04/01/2020   Lab Results  Component Value Date   FERRITIN 1,418 (H) 03/31/2020   Lab Results  Component Value Date   FOLATE 7.3 03/31/2020   Lab Results  Component Value Date   VITAMINB12 840 03/31/2020      Imaging Studies: DG Chest 2 View  Result Date: 05/12/2020 CLINICAL DATA:  Anasarca EXAM: CHEST - 2 VIEW COMPARISON:  04/08/2020 FINDINGS: Bibasilar linear opacities, likely atelectasis. Heart is normal size. No effusions. No acute bony abnormality. IMPRESSION: Bibasilar platelike linear densities, likely atelectasis. Electronically Signed   By: Rolm Baptise M.D.   On: 05/12/2020 00:31   CT Head Wo Contrast  Result Date: 05/12/2020 CLINICAL DATA:  Altered mental status, leg swelling and erythema, fall 2 months prior with head strike EXAM: CT HEAD WITHOUT CONTRAST TECHNIQUE: Contiguous axial images were obtained from the base of the skull through the vertex without intravenous contrast. COMPARISON:  CT 02/18/2017 FINDINGS: Brain: Region of remote cortically based right frontal encephalomalacia is unchanged from comparison imaging (2/21) No evidence of acute infarction, hemorrhage, hydrocephalus, extra-axial collection, visible mass lesion or mass effect. Vascular: Atherosclerotic calcification of the carotid siphons. No hyperdense vessel. Skull: No calvarial fracture or suspicious osseous lesion. No scalp swelling or hematoma. Sinuses/Orbits: Paranasal sinuses and mastoid air cells are predominantly clear. Included orbital structures are unremarkable. Other: None. IMPRESSION: 1. No acute intracranial abnormality. 2. No scalp swelling or calvarial fracture. 3. Stable appearance of remote cortically based right frontal lobe encephalomalacia.  Electronically Signed   By: Lovena Le M.D.   On: 05/12/2020 00:38   Impression:  59 y/o male with history of etoh abuse and recent diagnosis of cirrhosis.  Cirrhosis likely due to alcohol abuse. Couple of admissions since 03/2020, most recent one two weeks ago.   Cirrhosis: Complicated by anasarca.  Currently doing much better with fluid overload.  Diuretic regimen consist of Lasix 40 mg daily, spironolactone 100 mg daily.  Blood pressure currently low normal, asymptomatic.  If continues to do well with fluid overload, may be able to wean diuretic regimen down.  Recommend 2 g sodium restricted diet.  Will obtain labs to document current MELD Na.  Recheck for iron overload due to previously elevated ferritin which I suspect was more related to ongoing alcohol abuse at that time and Covid.  Check autoimmune markers, hepatitis a and B immunity.  Try to obtain last EGD report to determine whether he had evidence of esophageal varices.  Likely due at this time for screening.  Patient continues to consume "nonalcoholic" beer 5-6 daily.  Each 1 contains about 0.4% alcohol.  Encouraged him to try to cut back on that as well.  He will need to be alcohol free for potentially 6 months to be considered for liver transplant.  Further recommendations to follow.  Anemia: Recheck CBC at this time.

## 2020-06-04 NOTE — Patient Instructions (Signed)
1. Please get labs done today. We will contact you with results as available.  2. Continue to avoid alcohol as discussed today. If you don't, your liver function will continue to worsen. 3. We will continue your fluid pills (furosemide 40 mg daily, spironolactone 100 mg daily) at current dose for now.  If you continue to do well with no significant swelling, we may be able to drop the dose to furosemide 20 mg daily, spironolactone 50 mg daily (you can cut your pills in half).  Also if you develop lightheadedness, dizziness, blood pressure below 90/60, we may need to consider dropping back on your fluid pills.  Please let us know.   4. Your diet should consist of adequate protein. You can eat beans, fish, chicken, red meat (limit). AVOID undercooked or raw seafood.  5. Limit sodium (salt) intake to 2000 mg daily. Eating more than that can increase your swelling.  6. I will get copy of your labs, prior upper endoscopy and colonoscopy reports for review.  We will see you back in 3 months or call sooner if needed.     Cirrhosis  Cirrhosis is long-term (chronic) liver injury. The liver is the body's largest internal organ, and it performs many functions. It converts food into energy, removes toxic material from the blood, makes important proteins, and absorbs necessary vitamins from food. In cirrhosis, healthy liver cells are replaced by scar tissue. This prevents blood from flowing through the liver, making it difficult for the liver to function. Scarring of the liver cannot be reversed, but treatment can prevent it from getting worse. What are the causes? Common causes of this condition are hepatitis C and long-term alcohol abuse. Other causes include:  Nonalcoholic fatty liver disease. This happens when fat is deposited in the liver by causes other than alcohol.  Hepatitis B infection.  Autoimmune hepatitis. In this condition, the body's defense system (immune system) mistakenly attacks the liver  cells, causing irritation and swelling (inflammation).  Diseases that cause blockage of ducts inside the liver.  Inherited liver diseases, such as hemochromatosis. This is one of the most common inherited liver diseases. In this disease, deposits of iron collect in the liver and other organs.  Reactions to certain long-term medicines, such as amiodarone, a heart medicine.  Parasitic infections. These include schistosomiasis, which is caused by a flatworm.  Long-term contact to certain toxins. These toxins include certain organic solvents, such as toluene and chloroform. What increases the risk? You are more likely to develop this condition if:  You have certain types of viral hepatitis.  You abuse alcohol, especially if you are male.  You are overweight.  You share needles.  You have unprotected sex with someone who has viral hepatitis. What are the signs or symptoms? You may not have any signs and symptoms at first. Symptoms may not develop until the damage to your liver starts to get worse. Early symptoms may include:  Weakness and tiredness (fatigue).  Changes in sleep patterns or having trouble sleeping.  Itchiness.  Tenderness in the right-upper part of your abdomen.  Weight loss and muscle loss.  Nausea.  Loss of appetite.  Appearance of tiny blood vessels under the skin. Later symptoms may include:  Fatigue or weakness that is getting worse.  Yellow skin and eyes (jaundice).  Buildup of fluid in the abdomen (ascites). You may notice that your clothes are tight around your waist.  Weight gain.  Swelling of the feet and ankles (edema).  Trouble breathing.  Easy bruising and bleeding.  Vomiting blood.  Black or bloody stool.  Mental confusion. How is this diagnosed? Your health care provider may suspect cirrhosis based on your symptoms and medical history, especially if you have other medical conditions or a history of alcohol abuse. Your health  care provider will do a physical exam to feel your liver and to check for signs of cirrhosis. He or she may perform other tests, including:  Blood tests to check: ? For hepatitis B or C. ? Kidney function. ? Liver function.  Imaging tests such as: ? MRI or CT scan to look for changes seen in advanced cirrhosis. ? Ultrasound to see if normal liver tissue is being replaced by scar tissue.  A procedure in which a long needle is used to take a sample of liver tissue to be checked in a lab (biopsy). Liver biopsy can confirm the diagnosis of cirrhosis. How is this treated? Treatment for this condition depends on how damaged your liver is and what caused the damage. It may include treating the symptoms of cirrhosis, or treating the underlying causes in order to slow the damage. Treatment may include:  Making lifestyle changes, such as: ? Eating a healthy diet. You may need to work with your health care provider or a diet and nutrition specialist (dietitian) to develop an eating plan. ? Restricting salt intake. ? Maintaining a healthy weight. ? Not abusing drugs or alcohol.  Taking medicines to: ? Treat liver infections or other infections. ? Control itching. ? Reduce fluid buildup. ? Reduce certain blood toxins. ? Reduce risk of bleeding from enlarged blood vessels in the stomach or esophagus (varices).  Liver transplant. In this procedure, a liver from a donor is used to replace your diseased liver. This is done if cirrhosis has caused liver failure. Other treatments and procedures may be done depending on the problems that you get from cirrhosis. Common problems include liver-related kidney failure (hepatorenal syndrome). Follow these instructions at home:   Take medicines only as told by your health care provider. Do not use medicines that are toxic to your liver. Ask your health care provider before taking any new medicines, including over-the-counter medicines.  Rest as  needed.  Eat a well-balanced diet. Ask your health care provider or dietitian for more information.  Limit your salt or water intake, if your health care provider asks you to do this.  Do not drink alcohol. This is especially important if you are taking acetaminophen.  Keep all follow-up visits as told by your health care provider. This is important. Contact a health care provider if you:  Have fatigue or weakness that is getting worse.  Develop swelling of the hands, feet, legs, or face.  Have a fever.  Develop loss of appetite.  Have nausea or vomiting.  Develop jaundice.  Develop easy bruising or bleeding. Get help right away if you:  Vomit bright red blood or a material that looks like coffee grounds.  Have blood in your stools.  Notice that your stools appear black and tarry.  Become confused.  Have chest pain or trouble breathing. Summary  Cirrhosis is chronic liver injury. Liver damage cannot be reversed. Common causes are hepatitis C and long-term alcohol abuse.  Tests used to diagnose cirrhosis include blood tests, imaging tests, and liver biopsy.  Treatment for this condition involves treating the underlying cause. Avoid alcohol, drugs, salt, and medicines that may damage your liver.  Contact your health care provider if you develop ascites,  edema, jaundice, fever, nausea or vomiting, easy bruising or bleeding, or worsening fatigue. This information is not intended to replace advice given to you by your health care provider. Make sure you discuss any questions you have with your health care provider. Document Revised: 10/13/2018 Document Reviewed: 05/13/2017 Elsevier Patient Education  2020 [employer]    Protein Content in Foods  Generally, most healthy people need around 50 grams of protein each day. Depending on your overall health, you may need more or less protein in your diet. Talk to your health care provider or dietitian about how much protein  you need. See the following list for the protein content of some common foods. High-protein foods High-protein foods contain 4 grams (4 g) or more of protein per serving. They include:  Beef, ground sirloin (cooked) -- 3 oz have 24 g of protein.  Cheese (hard) -- 1 oz has 7 g of protein.  Chicken breast, boneless and skinless (cooked) -- 3 oz have 13.4 g of protein.  Cottage cheese -- 1/2 cup has 13.4 g of protein.  Egg -- 1 egg has 6 g of protein.  Fish, filet (cooked) -- 1 oz has 6-7 g of protein.  Garbanzo beans (canned or cooked) -- 1/2 cup has 6-7 g of protein.  Kidney beans (canned or cooked) -- 1/2 cup has 6-7 g of protein.  Lamb (cooked) -- 3 oz has 24 g of protein.  Milk -- 1 cup (8 oz) has 8 g of protein.  Nuts (peanuts, pistachios, almonds) -- 1 oz has 6 g of protein.  Peanut butter -- 1 oz has 7-8 g of protein.  Pork tenderloin (cooked) -- 3 oz has 18.4 g of protein.  Pumpkin seeds -- 1 oz has 8.5 g of protein.  Soybeans (roasted) -- 1 oz has 8 g of protein.  Soybeans (cooked) -- 1/2 cup has 11 g of protein.  Soy milk -- 1 cup (8 oz) has 5-10 g of protein.  Soy or vegetable patty -- 1 patty has 11 g of protein.  Sunflower seeds -- 1 oz has 5.5 g of protein.  Tofu (firm) -- 1/2 cup has 20 g of protein.  Tuna (canned in water) -- 3 oz has 20 g of protein.  Yogurt -- 6 oz has 8 g of protein. Low-protein foods Low-protein foods contain 3 grams (3 g) or less of protein per serving. They include:  Beets (raw or cooked) -- 1/2 cup has 1.5 g of protein.  Bran cereal -- 1/2 cup has 2-3 g of protein.  Bread -- 1 slice has 2.5 g of protein.  Broccoli (raw or cooked) -- 1/2 cup has 2 g of protein.  Collard greens (raw or cooked) -- 1/2 cup has 2 g of protein.  Corn (fresh or cooked) -- 1/2 cup has 2 g of protein.  Cream cheese -- 1 oz has 2 g of protein.  Creamer (half-and-half) -- 1 oz has 1 g of protein.  Flour tortilla -- 1 tortilla has 2.5 g of  protein  Frozen yogurt -- 1/2 cup has 3 g of protein.  Fruit or vegetable juice -- 1/2 cup has 1 g of protein.  Green beans (raw or cooked) -- 1/2 cup has 1 g of protein.  Green peas (canned) -- 1/2 cup has 3.5 g of protein.  Muffins -- 1 small muffin (2 oz) has 3 g of protein.  Oatmeal (cooked) -- 1/2 cup has 3 g of protein.  Potato (baked with  skin) -- 1 medium potato has 3 g of protein.  Rice (cooked) -- 1/2 cup has 2.5-3.5 g of protein.  Sour cream -- 1/2 cup has 2.5 g of protein.  Spinach (cooked) -- 1/2 cup has 3 g of protein.  Squash (cooked) -- 1/2 cup has 1.5 g of protein. Actual amounts of protein may be different depending on processing. Talk with your health care provider or dietitian about what foods are recommended for you. This information is not intended to replace advice given to you by your health care provider. Make sure you discuss any questions you have with your health care provider. Document Revised: 03/03/2016 Document Reviewed: 03/03/2016 Elsevier Patient Education  2020 Elsevier Inc.  Two Gram Sodium Diet 2000 mg  What is Sodium? Sodium is a mineral found naturally in many foods. The most significant source of sodium in the diet is table salt, which is about 40% sodium.  Processed, convenience, and preserved foods also contain a large amount of sodium.  The body needs only 500 mg of sodium daily to function,  A normal diet provides more than enough sodium even if you do not use salt.  Why Limit Sodium? A build up of sodium in the body can cause thirst, increased blood pressure, shortness of breath, and water retention.  Decreasing sodium in the diet can reduce edema and risk of heart attack or stroke associated with high blood pressure.  Keep in mind that there are many other factors involved in these health problems.  Heredity, obesity, lack of exercise, cigarette smoking, stress and what you eat all play a role.  General Guidelines:  Do not add salt at  the table or in cooking.  One teaspoon of salt contains over 2 grams of sodium.  Read food labels  Avoid processed and convenience foods  Ask your dietitian before eating any foods not dicussed in the menu planning guidelines  Consult your physician if you wish to use a salt substitute or a sodium containing medication such as antacids.  Limit milk and milk products to 16 oz (2 cups) per day.  Shopping Hints:  READ LABELS!! "Dietetic" does not necessarily mean low sodium.  Salt and other sodium ingredients are often added to foods during processing.   Menu Planning Guidelines Food Group Choose More Often Avoid  Beverages (see also the milk group All fruit juices, low-sodium, salt-free vegetables juices, low-sodium carbonated beverages Regular vegetable or tomato juices, commercially softened water used for drinking or cooking  Breads and Cereals Enriched white, wheat, rye and pumpernickel bread, hard rolls and dinner rolls; muffins, cornbread and waffles; most dry cereals, cooked cereal without added salt; unsalted crackers and breadsticks; low sodium or homemade bread crumbs Bread, rolls and crackers with salted tops; quick breads; instant hot cereals; pancakes; commercial bread stuffing; self-rising flower and biscuit mixes; regular bread crumbs or cracker crumbs  Desserts and Sweets Desserts and sweets mad with mild should be within allowance Instant pudding mixes and cake mixes  Fats Butter or margarine; vegetable oils; unsalted salad dressings, regular salad dressings limited to 1 Tbs; light, sour and heavy cream Regular salad dressings containing bacon fat, bacon bits, and salt pork; snack dips made with instant soup mixes or processed cheese; salted nuts  Fruits Most fresh, frozen and canned fruits Fruits processed with salt or sodium-containing ingredient (some dried fruits are processed with sodium sulfites        Vegetables Fresh, frozen vegetables and low- sodium canned  vegetables Regular canned vegetables,  sauerkraut, pickled vegetables, and others prepared in brine; frozen vegetables in sauces; vegetables seasoned with ham, bacon or salt pork  Condiments, Sauces, Miscellaneous  Salt substitute with physician's approval; pepper, herbs, spices; vinegar, lemon or lime juice; hot pepper sauce; garlic powder, onion powder, low sodium soy sauce (1 Tbs.); low sodium condiments (ketchup, chili sauce, mustard) in limited amounts (1 tsp.) fresh ground horseradish; unsalted tortilla chips, pretzels, potato chips, popcorn, salsa (1/4 cup) Any seasoning made with salt including garlic salt, celery salt, onion salt, and seasoned salt; sea salt, rock salt, kosher salt; meat tenderizers; monosodium glutamate; mustard, regular soy sauce, barbecue, sauce, chili sauce, teriyaki sauce, steak sauce, Worcestershire sauce, and most flavored vinegars; canned gravy and mixes; regular condiments; salted snack foods, olives, picles, relish, horseradish sauce, catsup   Food preparation: Try these seasonings Meats:    Pork Sage, onion Serve with applesauce  Chicken Poultry seasoning, thyme, parsley Serve with cranberry sauce  Lamb Curry powder, rosemary, garlic, thyme Serve with mint sauce or jelly  Veal Marjoram, basil Serve with current jelly, cranberry sauce  Beef Pepper, bay leaf Serve with dry mustard, unsalted chive butter  Fish Bay leaf, dill Serve with unsalted lemon butter, unsalted parsley butter  Vegetables:    Asparagus Lemon juice   Broccoli Lemon juice   Carrots Mustard dressing parsley, mint, nutmeg, glazed with unsalted butter and sugar   Green beans Marjoram, lemon juice, nutmeg,dill seed   Tomatoes Basil, marjoram, onion   Spice /blend for [employer]" 4 tsp ground thyme 1 tsp ground sage 3 tsp ground rosemary 4 tsp ground marjoram   Test your knowledge 9. A product that says "Salt Free" may still contain sodium. True or False 10. Garlic Powder and Hot Pepper Sauce  an be used as alternative seasonings.True or False 11. Processed foods have more sodium than fresh foods.  True or False 12. Canned Vegetables have less sodium than froze True or False  WAYS TO DECREASE YOUR SODIUM INTAKE 4. Avoid the use of added salt in cooking and at the table.  Table salt (and other prepared seasonings which contain salt) is probably one of the greatest sources of sodium in the diet.  Unsalted foods can gain flavor from the sweet, sour, and butter taste sensations of herbs and spices.  Instead of using salt for seasoning, try the following seasonings with the foods listed.  Remember: how you use them to enhance natural food flavors is limited only by your creativity... Allspice-Meat, fish, eggs, fruit, peas, red and yellow vegetables Almond Extract-Fruit baked goods Anise Seed-Sweet breads, fruit, carrots, beets, cottage cheese, cookies (tastes like licorice) Basil-Meat, fish, eggs, vegetables, rice, vegetables salads, soups, sauces Bay Leaf-Meat, fish, stews, poultry Burnet-Salad, vegetables (cucumber-like flavor) Caraway Seed-Bread, cookies, cottage cheese, meat, vegetables, cheese, rice Cardamon-Baked goods, fruit, soups Celery Powder or seed-Salads, salad dressings, sauces, meatloaf, soup, bread.Do not use  celery salt Chervil-Meats, salads, fish, eggs, vegetables, cottage cheese (parsley-like flavor) Chili Power-Meatloaf, chicken cheese, corn, eggplant, egg dishes Chives-Salads cottage cheese, egg dishes, soups, vegetables, sauces Cilantro-Salsa, casseroles Cinnamon-Baked goods, fruit, pork, lamb, chicken, carrots Cloves-Fruit, baked goods, fish, pot roast, green beans, beets, carrots Coriander-Pastry, cookies, meat, salads, cheese (lemon-orange flavor) Cumin-Meatloaf, fish,cheese, eggs, cabbage,fruit pie (caraway flavor) [employer], fruit, eggs, fish, poultry, cottage cheese, vegetables Dill Seed-Meat, cottage cheese, poultry, vegetables, fish, salads,  bread Fennel Seed-Bread, cookies, apples, pork, eggs, fish, beets, cabbage, cheese, Licorice-like flavor Garlic-(buds or powder) Salads, meat, poultry, fish, bread, butter, vegetables, potatoes.Do not  use garlic  salt Ginger-Fruit, vegetables, baked goods, meat, fish, poultry Horseradish Root-Meet, vegetables, butter Lemon Juice or Extract-Vegetables, fruit, tea, baked goods, fish salads Mace-Baked goods fruit, vegetables, fish, poultry (taste like nutmeg) Maple Extract-Syrups Marjoram-Meat, chicken, fish, vegetables, breads, green salads (taste like Sage) Mint-Tea, lamb, sherbet, vegetables, desserts, carrots, cabbage Mustard, Dry or Seed-Cheese, eggs, meats, vegetables, poultry Nutmeg-Baked goods, fruit, chicken, eggs, vegetables, desserts Onion Powder-Meat, fish, poultry, vegetables, cheese, eggs, bread, rice salads (Do not use   Onion salt) Orange Extract-Desserts, baked goods Oregano-Pasta, eggs, cheese, onions, pork, lamb, fish, chicken, vegetables, green salads Paprika-Meat, fish, poultry, eggs, cheese, vegetables Parsley Flakes-Butter, vegetables, meat fish, poultry, eggs, bread, salads (certain forms may   Contain sodium Pepper-Meat fish, poultry, vegetables, eggs Peppermint Extract-Desserts, baked goods Poppy Seed-Eggs, bread, cheese, fruit dressings, baked goods, noodles, vegetables, cottage  [employer], poultry, meat, fish, cauliflower, turnips,eggs bread Saffron-Rice, bread, veal, chicken, fish, eggs Sage-Meat, fish, poultry, onions, eggplant, tomateos, pork, stews Savory-Eggs, salads, poultry, meat, rice, vegetables, soups, pork Tarragon-Meat, poultry, fish, eggs, butter, vegetables (licorice-like flavor)  Thyme-Meat, poultry, fish, eggs, vegetables, (clover-like flavor), sauces, soups Tumeric-Salads, butter, eggs, fish, rice, vegetables (saffron-like flavor) Vanilla Extract-Baked goods, candy Vinegar-Salads, vegetables, meat  marinades Walnut Extract-baked goods, candy  2. Choose your Foods Wisely   The following is a list of foods to avoid which are high in sodium:  Meats-Avoid all smoked, canned, salt cured, dried and kosher meat and fish as well as Anchovies   [employer] meats:Bologna, Liverwurst, Pastrami Canned meat or fish  Marinated herring Caviar    Pepperoni Corned Beef   Pizza Dried chipped beef  Salami Frozen breaded fish or meat Salt pork Frankfurters or hot dogs  Sardines Gefilte fish   Sausage Ham (boiled ham, Proscuitto Smoked butt    spiced ham)   Spam      TV Dinners Vegetables Canned vegetables (Regular) Relish Canned mushrooms  Sauerkraut Olives    Tomato juice Pickles  Bakery and Dessert Products Canned puddings  Cream pies Cheesecake   Decorated cakes Cookies  Beverages/Juices Tomato juice, regular  Gatorade   V-8 vegetable juice, regular  Breads and Cereals Biscuit mixes   Salted potato chips, corn chips, pretzels Bread stuffing mixes  Salted crackers and rolls Pancake and waffle mixes Self-rising flour  Seasonings Accent    Meat sauces Barbecue sauce  Meat tenderizer Catsup    Monosodium glutamate (MSG) Celery salt   Onion salt Chili sauce   Prepared mustard Garlic salt   Salt, seasoned salt, sea salt Gravy mixes   Soy sauce Horseradish   Steak sauce Ketchup   Tartar sauce Lite salt    Teriyaki sauce Marinade mixes   Worcestershire sauce  Others Baking powder   Cocoa and cocoa mixes Baking soda   Commercial casserole mixes Candy-caramels, chocolate  Dehydrated soups    Bars, fudge,nougats  Instant rice and pasta mixes Canned broth or soup  Maraschino cherries Cheese, aged and processed cheese and cheese spreads

## 2020-06-05 ENCOUNTER — Telehealth: Payer: Self-pay

## 2020-06-05 NOTE — Telephone Encounter (Signed)
Lab results received from [facility] have been placed in [place], PA's office for review.

## 2020-06-07 LAB — COMPREHENSIVE METABOLIC PANEL
ALT: 13 IU/L (ref 0–44)
AST: 23 IU/L (ref 0–40)
Albumin/Globulin Ratio: 0.9 — ABNORMAL LOW (ref 1.2–2.2)
Albumin: 3.2 g/dL — ABNORMAL LOW (ref 3.8–4.9)
Alkaline Phosphatase: 87 IU/L (ref 44–121)
BUN/Creatinine Ratio: 11 (ref 9–20)
BUN: 8 mg/dL (ref 6–24)
Bilirubin Total: 0.3 mg/dL (ref 0.0–1.2)
CO2: 21 mmol/L (ref 20–29)
Calcium: 8.8 mg/dL (ref 8.7–10.2)
Chloride: 97 mmol/L (ref 96–106)
Creatinine, Ser: 0.7 mg/dL — ABNORMAL LOW (ref 0.76–1.27)
GFR calc Af Amer: 120 mL/min/{1.73_m2} (ref >59)
GFR calc non Af Amer: 104 mL/min/{1.73_m2} (ref >59)
Globulin, Total: 3.4 g/dL (ref 1.5–4.5)
Glucose: 150 mg/dL — ABNORMAL HIGH (ref 65–99)
Potassium: 4.5 mmol/L (ref 3.5–5.2)
Sodium: 133 mmol/L — ABNORMAL LOW (ref 134–144)
Total Protein: 6.6 g/dL (ref 6.0–8.5)

## 2020-06-07 LAB — AFP TUMOR MARKER: AFP, Serum, Tumor Marker: 4.7 ng/mL (ref 0.0–8.3)

## 2020-06-07 LAB — IGM, IMMUNOGLOBULIN M (RDL): IgM Immunoglobulin M (RDL): 129 mg/dL (ref 26–217)

## 2020-06-07 LAB — CBC/DIFF AMBIGUOUS DEFAULT
Basophils Absolute: 0 10*3/uL (ref 0.0–0.2)
Basos: 1 %
EOS (ABSOLUTE): 0.1 10*3/uL (ref 0.0–0.4)
Eos: 2 %
Hematocrit: 34.1 % — ABNORMAL LOW (ref 37.5–51.0)
Hemoglobin: 11 g/dL — ABNORMAL LOW (ref 13.0–17.7)
Immature Grans (Abs): 0 10*3/uL (ref 0.0–0.1)
Immature Granulocytes: 1 %
Lymphocytes Absolute: 0.8 10*3/uL (ref 0.7–3.1)
Lymphs: 17 %
MCH: 32.8 pg (ref 26.6–33.0)
MCHC: 32.3 g/dL (ref 31.5–35.7)
MCV: 102 fL — ABNORMAL HIGH (ref 79–97)
Monocytes Absolute: 0.5 10*3/uL (ref 0.1–0.9)
Monocytes: 9 %
Neutrophils Absolute: 3.4 10*3/uL (ref 1.4–7.0)
Neutrophils: 70 %
Platelets: 276 10*3/uL (ref 150–450)
RBC: 3.35 x10E6/uL — ABNORMAL LOW (ref 4.14–5.80)
RDW: 12 % (ref 11.6–15.4)
WBC: 4.9 10*3/uL (ref 3.4–10.8)

## 2020-06-07 LAB — PROTIME-INR
INR: 1 (ref 0.9–1.2)
Prothrombin Time: 10.8 s (ref 9.1–12.0)

## 2020-06-07 LAB — FERRITIN: Ferritin: 351 ng/mL (ref 30–400)

## 2020-06-07 LAB — IRON AND TIBC
Iron Saturation: 18 % (ref 15–55)
Iron: 38 ug/dL (ref 38–169)
Total Iron Binding Capacity: 216 ug/dL — ABNORMAL LOW (ref 250–450)
UIBC: 178 ug/dL (ref 111–343)

## 2020-06-07 LAB — HEPATITIS A ANTIBODY, IGM: Hep A IgM: NEGATIVE

## 2020-06-07 LAB — IGG, IMMUNOGLOBULIN G (RDL): IgG Immunoglobulin G (RDL): 1007 mg/dL (ref 603–1613)

## 2020-06-07 LAB — SPECIMEN STATUS REPORT

## 2020-06-07 LAB — MITOCHONDRIAL/SMOOTH MUSCLE AB PNL
Mitochondrial Ab: 34.8 Units — ABNORMAL HIGH (ref 0.0–20.0)
Smooth Muscle Ab: 14 Units (ref 0–19)

## 2020-06-07 LAB — HEPATITIS B SURFACE ANTIBODY,QUALITATIVE: Hep B Surface Ab, Qual: REACTIVE

## 2020-06-07 LAB — IGA, IMMUNOGLOBULIN A (RDL): IgA, Immunoglobulin A (RDL): 706 mg/dL — ABNORMAL HIGH (ref 87–352)

## 2020-06-07 LAB — ANA: Anti Nuclear Antibody (ANA): NEGATIVE

## 2020-06-08 NOTE — Telephone Encounter (Signed)
Noted. See result notes.  

## 2020-06-11 ENCOUNTER — Telehealth: Payer: Self-pay | Admitting: Gastroenterology

## 2020-06-11 NOTE — Telephone Encounter (Signed)
Reviewed records:  Labs from PCP dated May 23, 2020: Creatinine 0.53, BUN 7, albumin 2.8, total bilirubin 0.4, alk phos 76, AST 35, ALT 15, white blood cell count 6000, hemoglobin 10.3, hematocrit 31.1, MCV 103.7, platelets 287,000, INR 1.0  Colonoscopy and EGD dated May 24, 2019 performed at person [facility]: Duodenal ulcer (benign biopsy with no H. pylori), moderate chronic gastritis (no H. pylori on pathology), distal esophagitis (benign biopsy), no esophageal varices.  Normal colon and rectum.  Due to prior history of colon polyps, recommended 5-year follow-up colonoscopy.    Please NIC for TCS 05/2024.

## 2020-06-11 NOTE — Telephone Encounter (Signed)
ON RECALL  °

## 2020-06-12 ENCOUNTER — Encounter: Payer: Self-pay | Admitting: Gastroenterology

## 2020-06-14 ENCOUNTER — Other Ambulatory Visit: Payer: Self-pay

## 2020-06-14 ENCOUNTER — Telehealth: Payer: Self-pay | Admitting: Internal Medicine

## 2020-06-14 DIAGNOSIS — K7031 Alcoholic cirrhosis of liver with ascites: Secondary | ICD-10-CM

## 2020-06-14 NOTE — Telephone Encounter (Signed)
Pt said he had labs done the end of November and was calling to see if his results were back yet. [phone]

## 2020-06-14 NOTE — Telephone Encounter (Signed)
Call has been addressed. See result note.

## 2020-06-19 ENCOUNTER — Encounter: Payer: Self-pay | Admitting: *Deleted

## 2020-07-26 ENCOUNTER — Telehealth: Payer: Self-pay

## 2020-07-26 ENCOUNTER — Other Ambulatory Visit: Payer: Self-pay

## 2020-07-26 DIAGNOSIS — K7031 Alcoholic cirrhosis of liver with ascites: Secondary | ICD-10-CM

## 2020-07-26 NOTE — Telephone Encounter (Signed)
Lab forms mailed to pt

## 2020-08-16 ENCOUNTER — Telehealth: Payer: Self-pay | Admitting: Internal Medicine

## 2020-08-16 NOTE — Telephone Encounter (Signed)
[phone] PLEASE CALL PATIENT, HE HAS SOME QUESTIONS FOR LESLIE ABOUT HIS CIRRHOSIS

## 2020-08-16 NOTE — Telephone Encounter (Signed)
Pt went to a doctor today ([facility]) and states that doctor looked at the notes from our office stating that pt has cirrhosis of the liver. Pt states our office has never discussed this with him and he wants some answers on this ASAP. We discussed lab results with pt previously. Please advise.

## 2020-08-17 NOTE — Telephone Encounter (Signed)
Spoke to patient.  Clifford Hunt also on the phone.  Patient was upset by his orthopedic doctor stating that our notes indicated that his liver was "shot".  And that he would need a liver transplant.  Discussed with patient at length, summarized her previous office visit from November (new patient consultation for cirrhosis).  Patient was diagnosed at an outside facility in September 2021 with cirrhosis when he was hospitalized.  Liver appeared cirrhotic on ultrasound.  Course complicated by anasarca in September 2021.  Underwent abdominal paracentesis in November 2021 while hospitalized at [facility] with alcohol withdrawal syndrome and possible Warnicke's. He quit regular beer in 03/2020. Quit non-alcoholic beer in 82/5053.   Patient has surgery for skin cancer scheduled for 2/28 so he needs to reschedule OV here. He sees his PCP late March and would like to get our labs then if possible. Come back here in 10/2020 to discuss EGD, ?need urso, follow up.   1. Please move patient's OV from 08/2020 to 10/2020. 2. Please arrange for patient to have labs done with his PCP in 09/2020 during his follow up there. He needs CBC, CMET, PT/INR, Hep A total antibody, AMA (mitochondrial antibody) 3. We will discuss EGD, RUQ U/S, ?urso at 10/2020 OV.

## 2020-08-21 ENCOUNTER — Other Ambulatory Visit: Payer: Self-pay

## 2020-08-21 DIAGNOSIS — Z1159 Encounter for screening for other viral diseases: Secondary | ICD-10-CM

## 2020-08-21 DIAGNOSIS — Z79899 Other long term (current) drug therapy: Secondary | ICD-10-CM

## 2020-08-21 DIAGNOSIS — K7030 Alcoholic cirrhosis of liver without ascites: Secondary | ICD-10-CM

## 2020-08-21 NOTE — Telephone Encounter (Signed)
Called patient and rescheduled him until April

## 2020-08-21 NOTE — Telephone Encounter (Signed)
Lab orders placed and mailed for pt to complete at his PCP 09/2020.

## 2020-09-03 ENCOUNTER — Ambulatory Visit: Payer: Medicare PPO | Admitting: Gastroenterology

## 2020-10-30 ENCOUNTER — Ambulatory Visit: Payer: Medicare PPO | Admitting: Gastroenterology

## 2020-10-30 ENCOUNTER — Encounter: Payer: Self-pay | Admitting: Internal Medicine

## 2020-12-06 ENCOUNTER — Other Ambulatory Visit (HOSPITAL_COMMUNITY): Payer: Self-pay | Admitting: Family Medicine

## 2020-12-06 DIAGNOSIS — N62 Hypertrophy of breast: Secondary | ICD-10-CM

## 2021-01-01 ENCOUNTER — Other Ambulatory Visit (HOSPITAL_COMMUNITY): Payer: Self-pay | Admitting: Family Medicine

## 2021-01-01 DIAGNOSIS — N63 Unspecified lump in unspecified breast: Secondary | ICD-10-CM

## 2021-01-09 ENCOUNTER — Ambulatory Visit (HOSPITAL_COMMUNITY)
Admission: RE | Admit: 2021-01-09 | Discharge: 2021-01-09 | Disposition: A | Discharge status: Home / Self Care | Payer: Medicare PPO | Source: Ambulatory Visit | Attending: Family Medicine | Admitting: Family Medicine

## 2021-01-09 DIAGNOSIS — N63 Unspecified lump in unspecified breast: Secondary | ICD-10-CM

## 2021-01-09 DIAGNOSIS — N62 Hypertrophy of breast: Secondary | ICD-10-CM | POA: Diagnosis not present

## 2021-04-14 IMAGING — DX DG CHEST 2V
2 series · 2 of 2 positions shown · non-contrast
Comparison: 04/08/2020

CLINICAL DATA: Anasarca

EXAM:
CHEST - 2 VIEW

[chest pa]
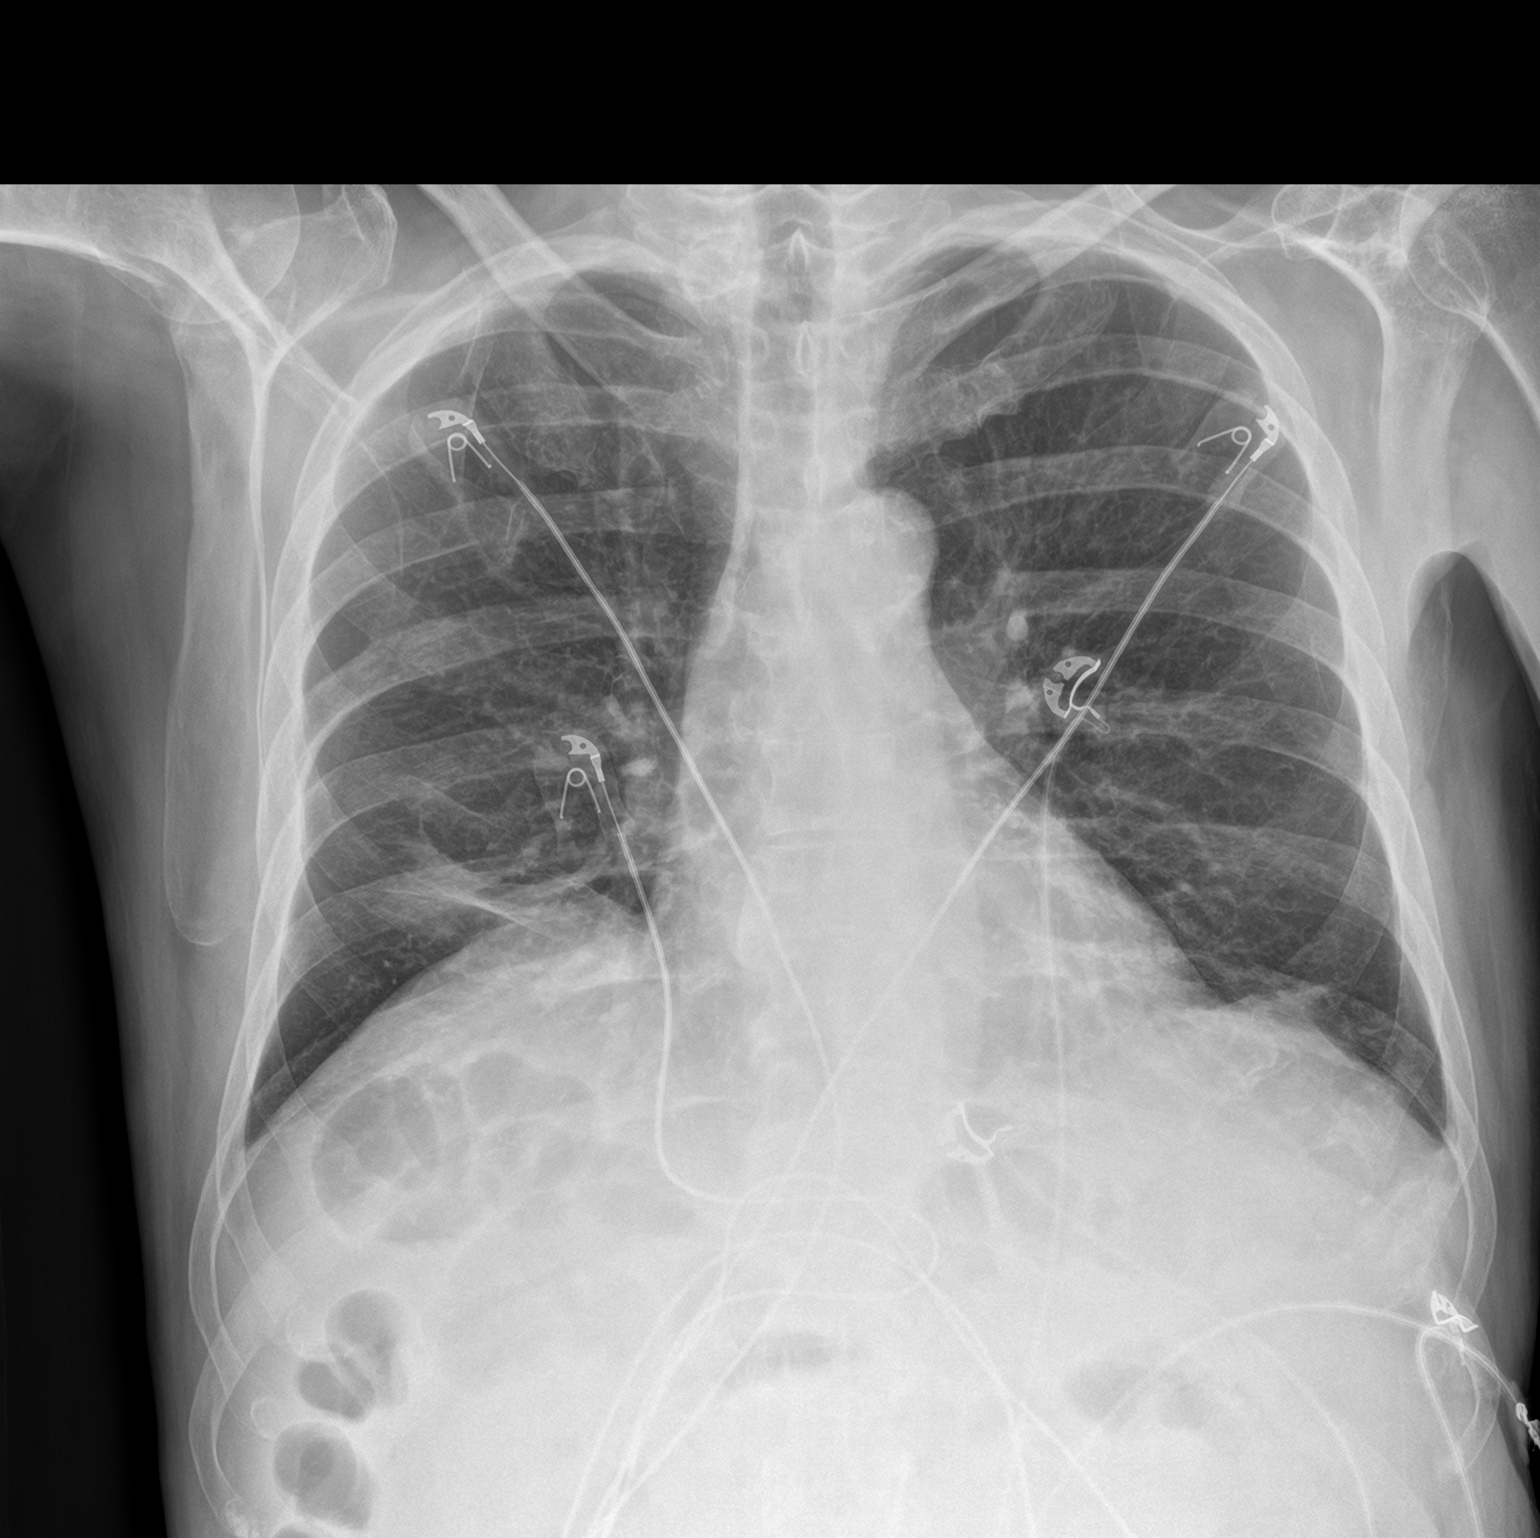

[chest lat]
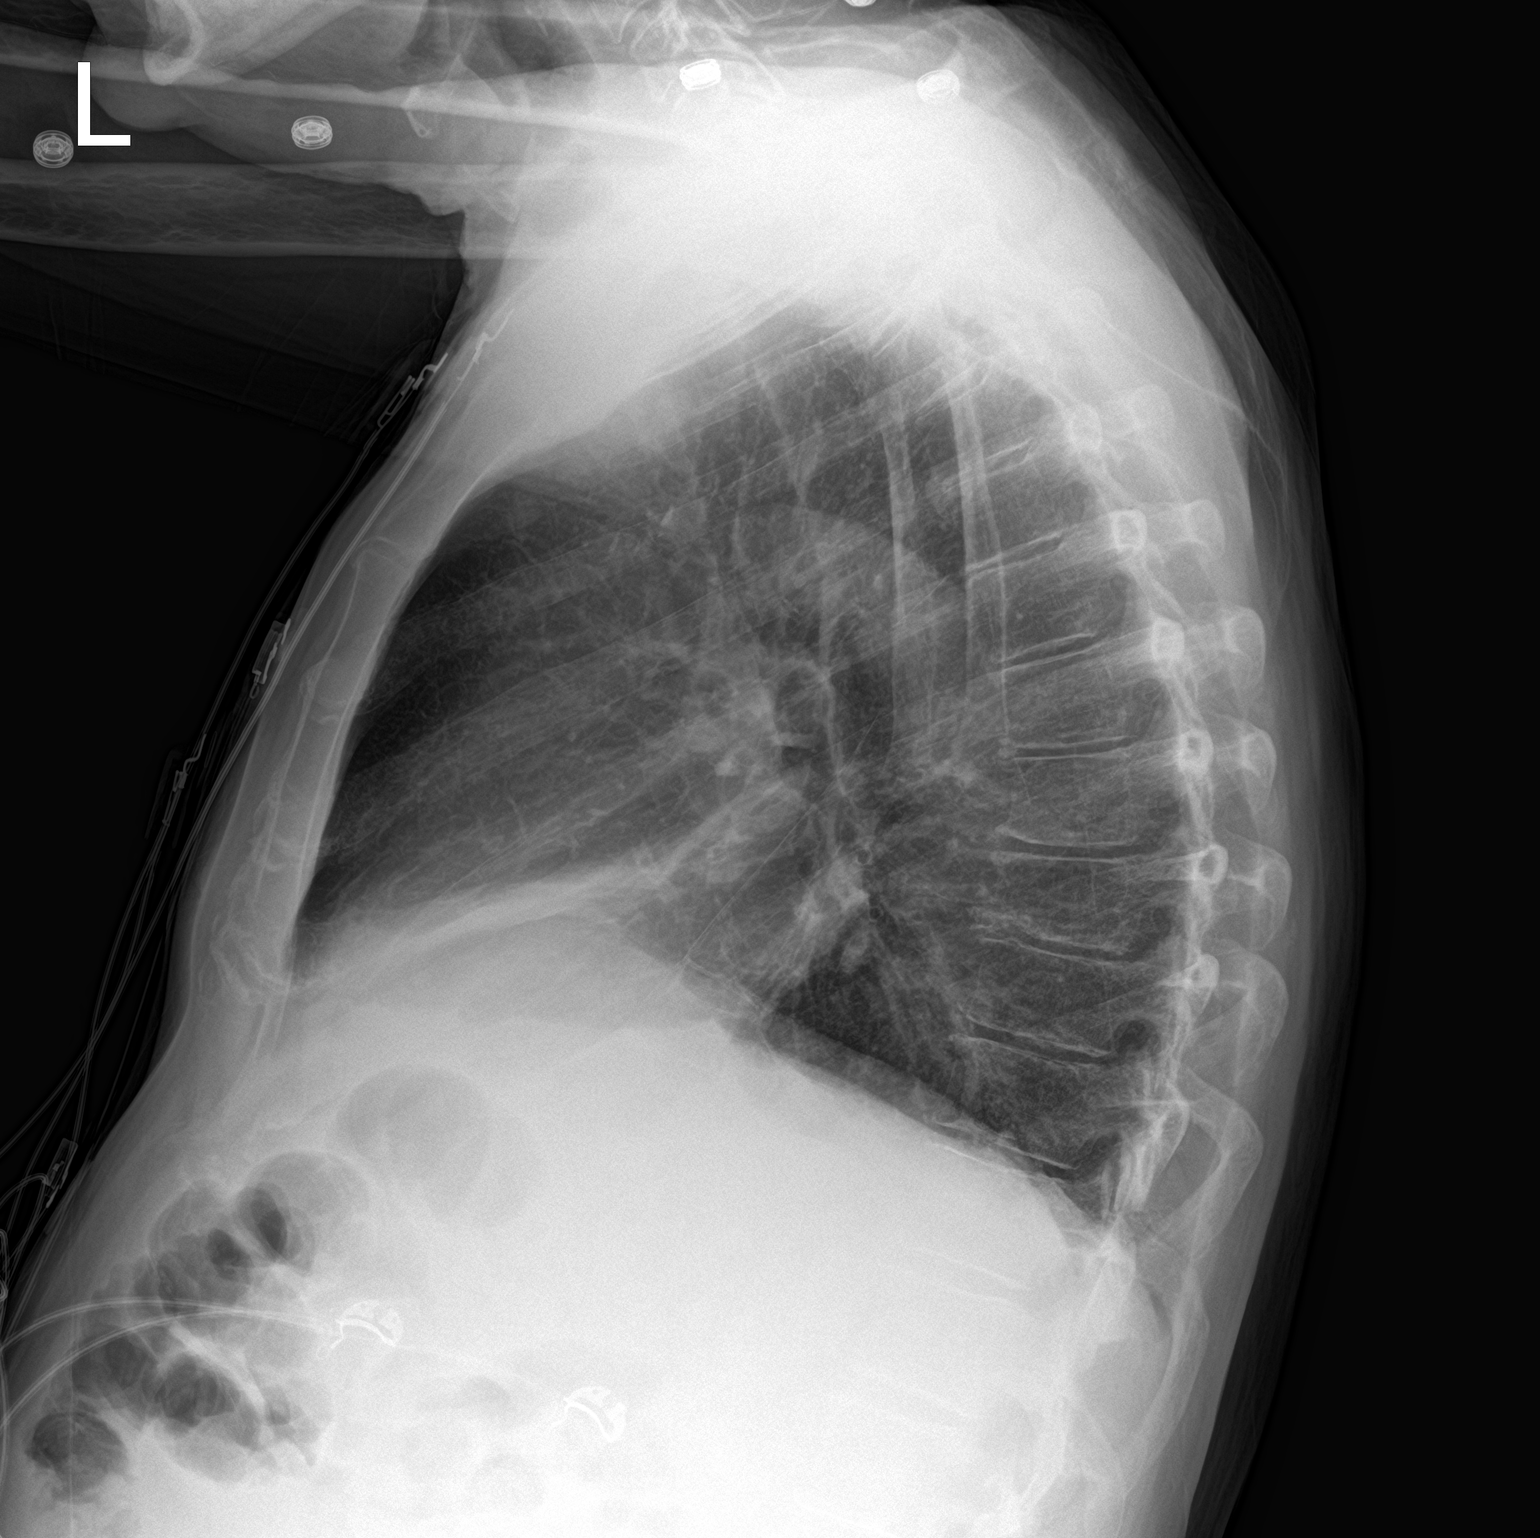

[2 of 2 positions shown; findings below may reference images not displayed]

FINDINGS: Bibasilar linear opacities, likely atelectasis. Heart is normal
size. No effusions. No acute bony abnormality.
IMPRESSION: Bibasilar platelike linear densities, likely atelectasis.

## 2021-04-14 IMAGING — CT CT HEAD W/O CM
3 series · 16 of 47 positions shown, 19 images · non-contrast
Comparison: CT 02/18/2017

CLINICAL DATA: Altered mental status, leg swelling and erythema,
fall 2 months prior with head strike

EXAM:
CT HEAD WITHOUT CONTRAST
TECHNIQUE: Contiguous axial images were obtained from the base of the skull
through the vertex without intravenous contrast.

[Series 2: head w o · axial · 0.45mm/px · z∈[-46,+84]mm · 10 of 32 slices shown, 13 images]
[im 3/32  brain]
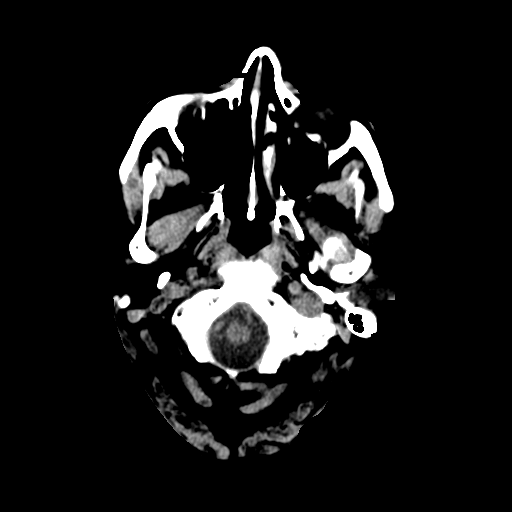
[im 3/32  bone]
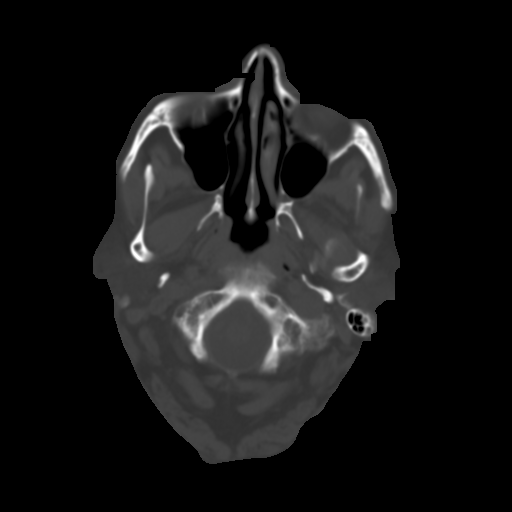
[im 6/32  brain]
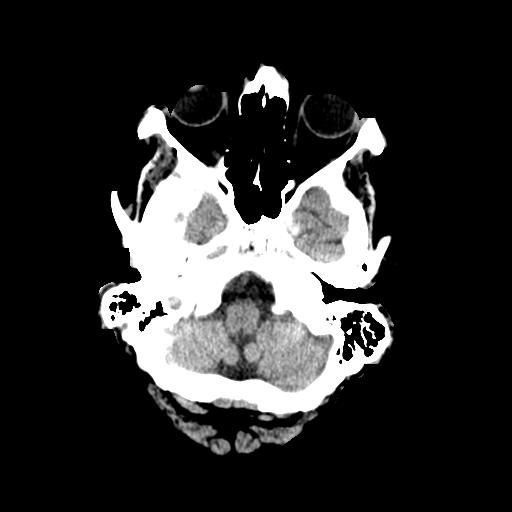
[im 9/32  brain]
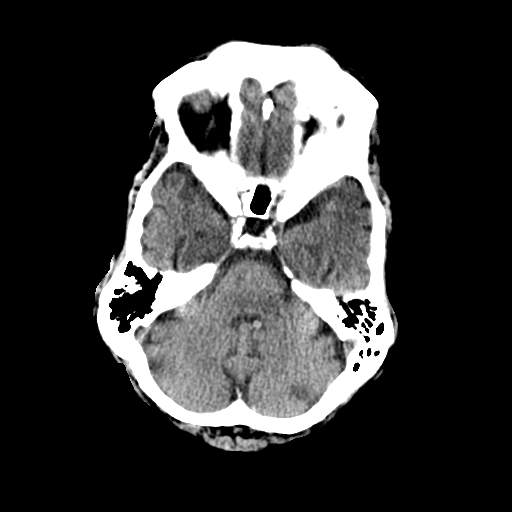
[im 11/32  brain]
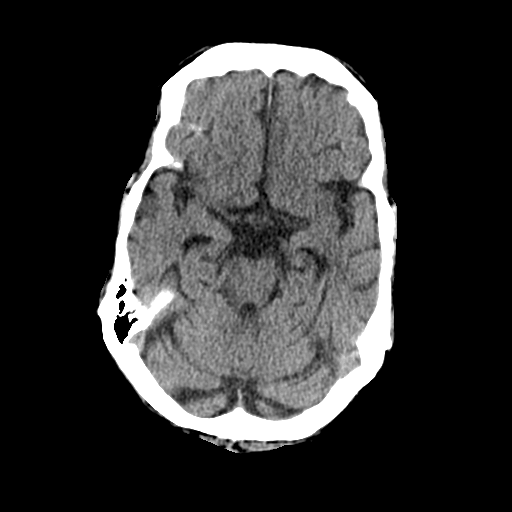
[im 14/32  brain]
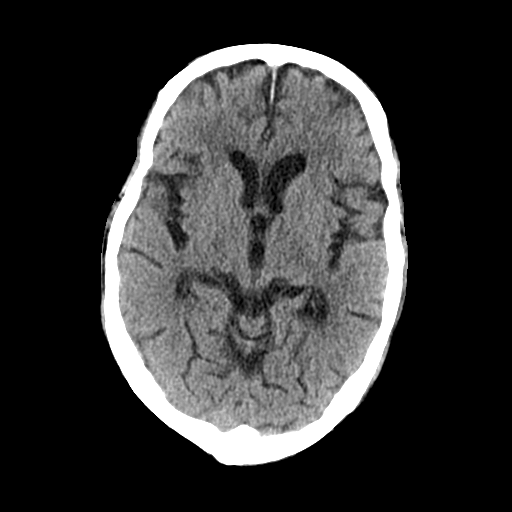
[im 14/32  bone]
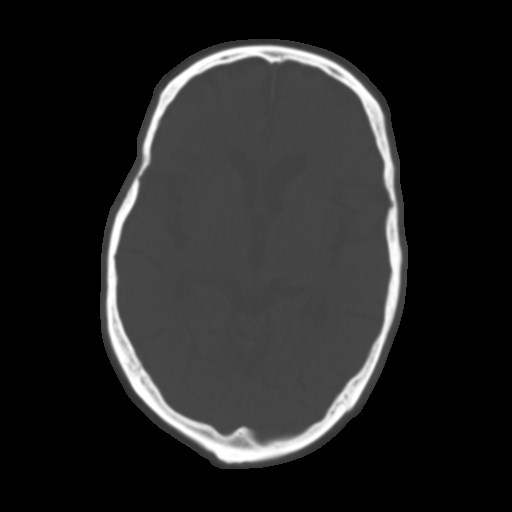
[im 18/32  brain]
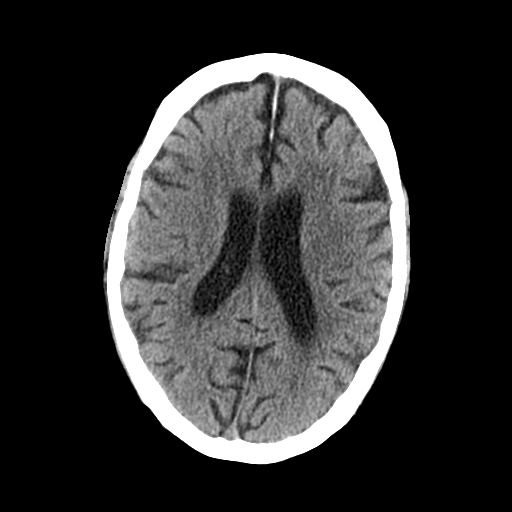
[im 21/32  brain]
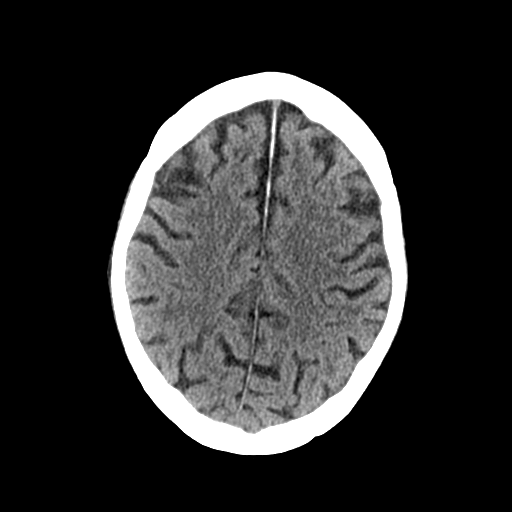
[im 24/32  brain]
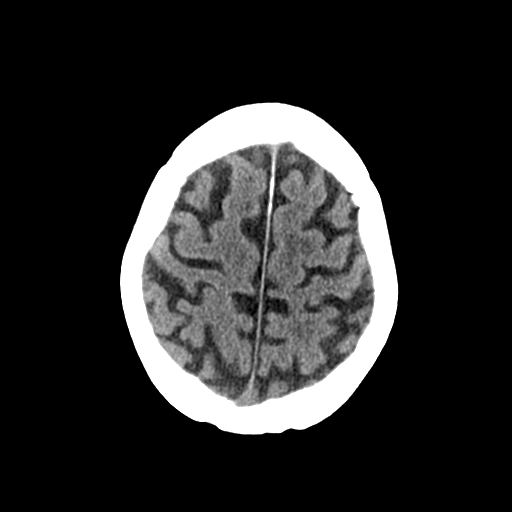
[im 26/32  brain]
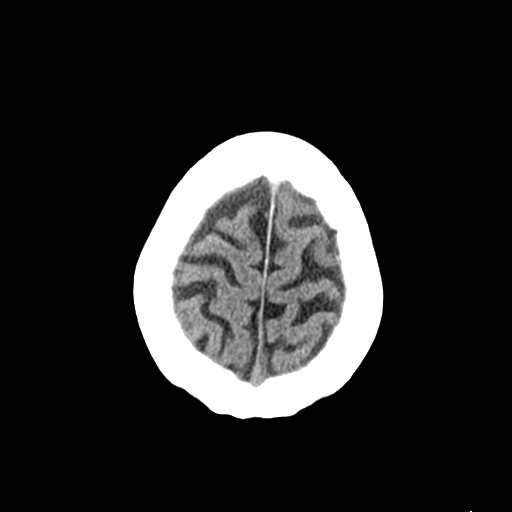
[im 26/32  bone]
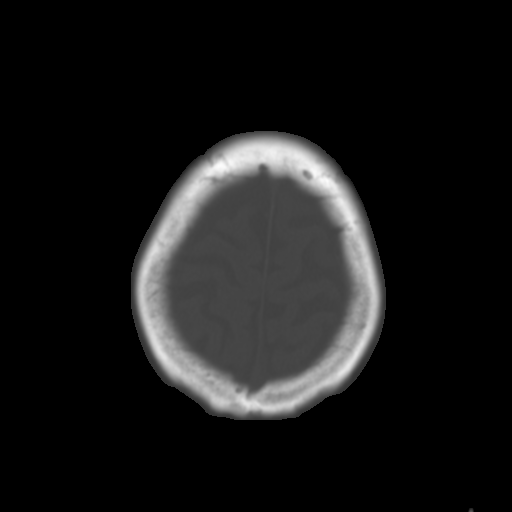
[im 29/32  brain]
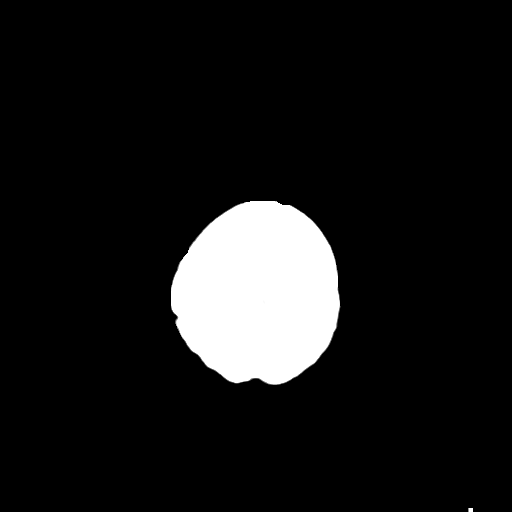

[Series 4: coronal soft · coronal · 0.32mm/px · 3 of 67 slices shown]
[im 23/67  brain]
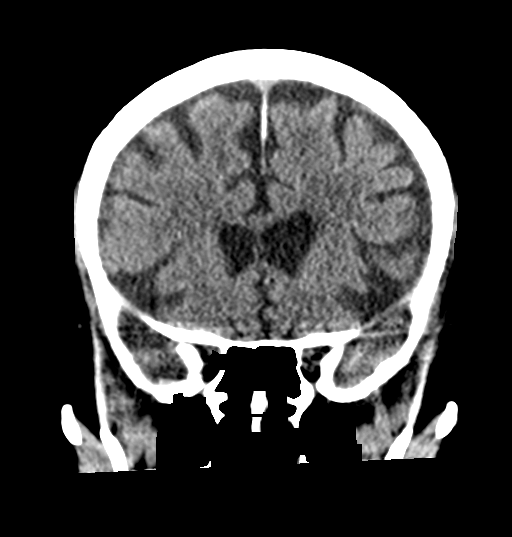
[im 30/67  brain]
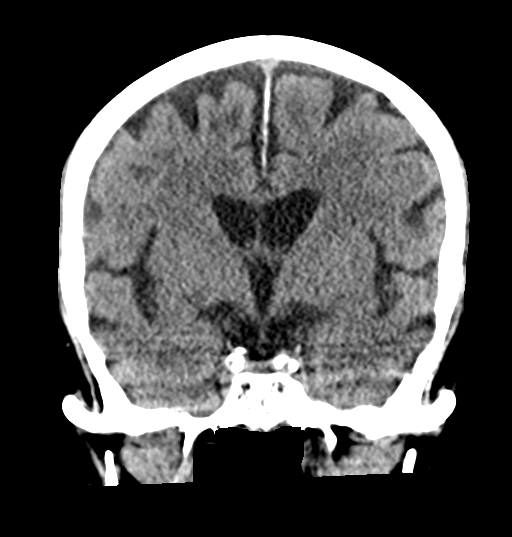
[im 37/67  brain]
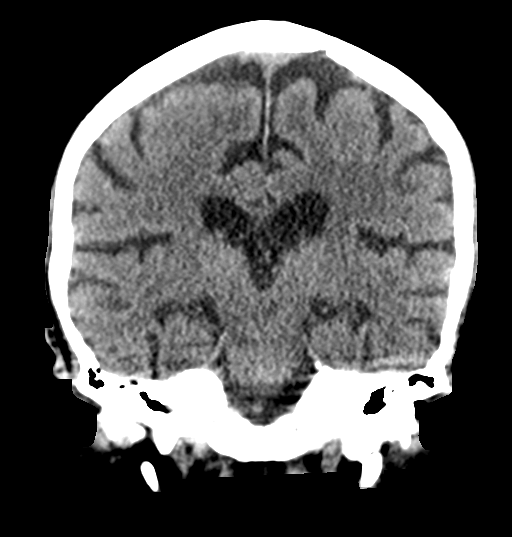

[Series 5: sagittal soft · sagittal · 0.34mm/px · 3 of 51 slices shown]
[im 17/51  brain]
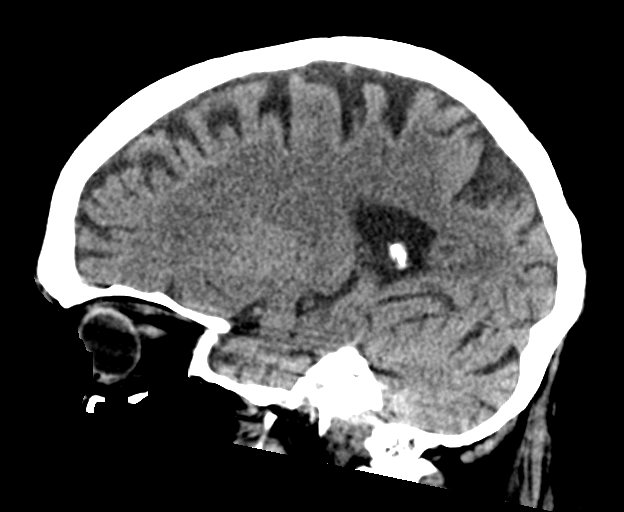
[im 26/51  brain]
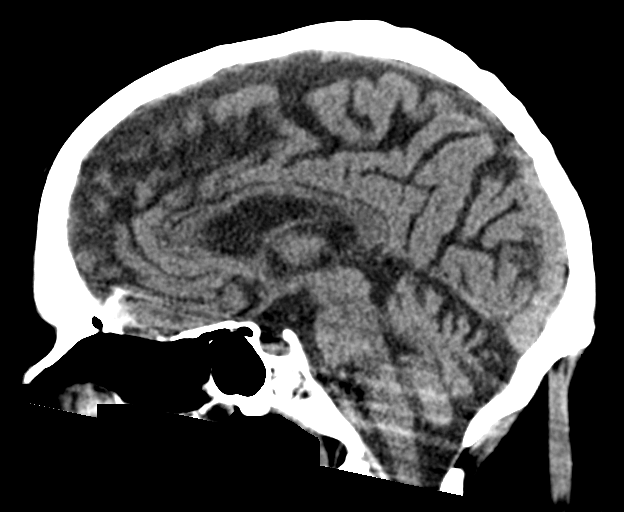
[im 34/51  brain]
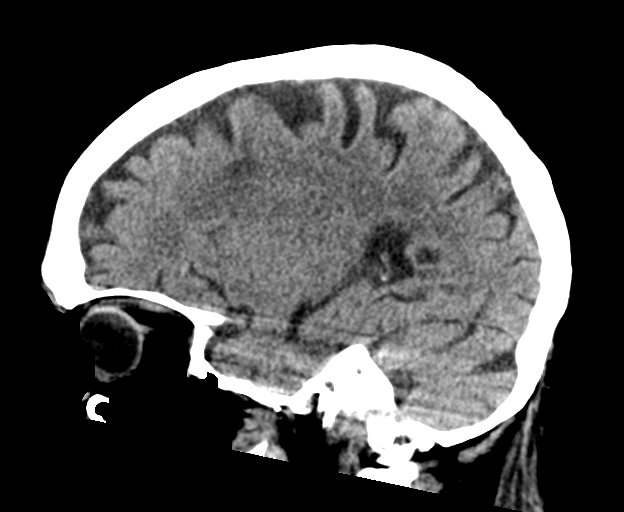

[16 of 47 positions shown; findings below may reference images not displayed]

FINDINGS: Brain: Region of remote cortically based right frontal
encephalomalacia is unchanged from comparison imaging ([DATE]) No
evidence of acute infarction, hemorrhage, hydrocephalus, extra-axial
collection, visible mass lesion or mass effect.

Vascular: Atherosclerotic calcification of the carotid siphons. No
hyperdense vessel.

Skull: No calvarial fracture or suspicious osseous lesion. No scalp
swelling or hematoma.

Sinuses/Orbits: Paranasal sinuses and mastoid air cells are
predominantly clear. Included orbital structures are unremarkable.

Other: None.
IMPRESSION: 1. No acute intracranial abnormality.
2. No scalp swelling or calvarial fracture.
3. Stable appearance of remote cortically based right frontal lobe
encephalomalacia.

## 2021-07-11 ENCOUNTER — Other Ambulatory Visit: Payer: Self-pay | Admitting: Family Medicine

## 2021-07-11 ENCOUNTER — Other Ambulatory Visit (HOSPITAL_COMMUNITY): Payer: Self-pay | Admitting: Family Medicine

## 2021-07-11 ENCOUNTER — Encounter: Payer: Self-pay | Admitting: Internal Medicine

## 2021-07-11 DIAGNOSIS — N62 Hypertrophy of breast: Secondary | ICD-10-CM

## 2021-07-11 DIAGNOSIS — K7031 Alcoholic cirrhosis of liver with ascites: Secondary | ICD-10-CM

## 2021-08-13 ENCOUNTER — Other Ambulatory Visit: Payer: Self-pay

## 2021-08-13 ENCOUNTER — Ambulatory Visit (HOSPITAL_COMMUNITY)
Admission: RE | Admit: 2021-08-13 | Discharge: 2021-08-13 | Disposition: A | Discharge status: Home / Self Care | Payer: Medicare PPO | Source: Ambulatory Visit | Attending: Family Medicine | Admitting: Family Medicine

## 2021-08-13 DIAGNOSIS — N62 Hypertrophy of breast: Secondary | ICD-10-CM | POA: Insufficient documentation

## 2021-08-15 ENCOUNTER — Other Ambulatory Visit (HOSPITAL_COMMUNITY): Payer: Self-pay | Admitting: Family Medicine

## 2021-08-15 DIAGNOSIS — K7031 Alcoholic cirrhosis of liver with ascites: Secondary | ICD-10-CM

## 2021-11-03 NOTE — Progress Notes (Deleted)
Referring Provider: [facility]* Primary Care Physician:  [facility] Primary GI Physician: Dr. Marland Kitchen  No chief complaint on file.   HPI:   Clifford Hunt is a 61 y.o. 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  Prior evaluation in 2021 with AMA elevated, but ANA, ASMA, IgG within normal limits.  Immune to hepatitis B.  Hepatitis C antibody negative.  Unknown hep A immunity status.  Cirrhosis previously complicated by anasarca with ascites, s/p paracentesis in 2020 through [facility].   Last EGD and colonoscopy in November 2020 at [facility] revealing duodenal ulcer (benign biopsy with no H. pylori), moderate chronic gastritis (no H. pylori on pathology), distal esophagitis (benign biopsy), no esophageal varices.  Normal colon and rectum.  Recommended 5-year follow-up due to history of colon polyps.   Last seen in our office 06/04/2020.  LE edema improved with Lasix 40 and spironolactone 100 mg daily.  He was drinking 5-6 nonalcoholic beer a day which contains 0.4% alcohol. Labs were updated corresponding to MELD 6. Anemia improving with Hgb 11.0. LFTs and INR wnl. Outside EGD/TCS records reviewed and recommended updating EGD, but this was not completed.   Patient no-show to his follow-up in April 2022.  Today:   Macrocytic anemia Need to repeat AMA   Past Medical History:  Diagnosis Date   Hypertension    Liver damage     Past Surgical History:  Procedure Laterality Date   COLONOSCOPY  05/2019   [facility]: normal exam. due to h/o colon polyps, next colonoscopy 05/2024   ESOPHAGOGASTRODUODENOSCOPY  05/2019    [facility]:Duodenal ulcer (benign biopsy with no H. pylori), moderate chronic gastritis (no H. pylori on pathology), distal esophagitis (benign biopsy), no esophageal varices.    Current Outpatient Medications  Medication Sig Dispense Refill   buPROPion (WELLBUTRIN XL) 150 MG 24 hr  tablet Take 150 mg by mouth daily.     feeding supplement, ENSURE ENLIVE, (ENSURE ENLIVE) LIQD Take 237 mLs by mouth 2 (two) times daily between meals. 237 mL 12   ferrous sulfate 325 (65 FE) MG tablet Take 1 tablet by mouth daily.     folic acid (FOLVITE) 1 MG tablet Take 1 tablet (1 mg total) by mouth daily.     furosemide (LASIX) 40 MG tablet Take 1 tablet (40 mg total) by mouth daily. 30 tablet    mirtazapine (REMERON) 15 MG tablet Take 1 tablet by mouth at bedtime.     Multiple Vitamin (MULTIVITAMIN WITH MINERALS) TABS tablet Take 1 tablet by mouth daily.     naltrexone (DEPADE) 50 MG tablet Take by mouth daily.     pantoprazole (PROTONIX) 40 MG tablet Take 1 tablet by mouth daily.     potassium chloride SA (KLOR-CON) 10 MEQ tablet Take 1 tablet (10 mEq total) by mouth daily.     spironolactone (ALDACTONE) 100 MG tablet Take 100 mg by mouth daily.     thiamine 100 MG tablet Take 1 tablet (100 mg total) by mouth daily.     Vitamin D, Ergocalciferol, (DRISDOL) 1.25 MG (50000 UNIT) CAPS capsule Take 50,000 Units by mouth every 7 (seven) days.     No current facility-administered medications for this visit.    Allergies as of 11/04/2021 - Review Complete 06/04/2020  Allergen Reaction Noted   Morphine and related Shortness Of Breath 02/18/2017   Pork-derived products Hives 06/04/2020   Albumin (human) Hives, Itching, and Palpitations 04/09/2020  Family History  Problem Relation Age of Onset   Breast cancer Mother    Heart attack Father    Colon cancer Neg Hx    Liver disease Neg Hx     Social History   Socioeconomic History   Marital status: Significant Other    Spouse name: Not on file   Number of children: Not on file   Years of education: Not on file   Highest education level: Not on file  Occupational History   Not on file  Tobacco Use   Smoking status: Every Day    Packs/day: 1.00    Types: Cigarettes   Smokeless tobacco: Never   Tobacco comments:    1 pack/day   Substance and Sexual Activity   Alcohol use: Not Currently    Comment: 12 pack/day; 06/04/20 denied, quit September 2021   Drug use: No   Sexual activity: Not on file  Other Topics Concern   Not on file  Social History Narrative   Not on file   Social Determinants of Health   Financial Resource Strain: Not on file  Food Insecurity: Not on file  Transportation Needs: Not on file  Physical Activity: Not on file  Stress: Not on file  Social Connections: Not on file    Review of Systems: Gen: Denies fever, chills, cold or flu like symptoms, presyncope, syncope. CV: Denies chest pain or palpitations. Resp: Denies dyspnea or cough.  GI: See HPI Heme: See HPI  Physical Exam: There were no vitals taken for this visit. General:   Alert and oriented. No distress noted. Pleasant and cooperative.  Head:  Normocephalic and atraumatic. Eyes:  Conjuctiva clear without scleral icterus. Heart:  S1, S2 present without murmurs appreciated. Lungs:  Clear to auscultation bilaterally. No wheezes, rales, or rhonchi. No distress.  Abdomen:  +BS, soft, non-tender and non-distended. No rebound or guarding. No HSM or masses noted. Msk:  Symmetrical without gross deformities. Normal posture. Extremities:  Without edema. Neurologic:  Alert and  oriented x4 Psych:  Normal mood and affect.    Assessment:     Plan:  ***   Ermalinda Memos, PA-C [facility] 11/04/2021

## 2021-11-04 ENCOUNTER — Ambulatory Visit: Payer: Medicare PPO | Admitting: Gastroenterology

## 2022-04-16 ENCOUNTER — Other Ambulatory Visit (HOSPITAL_COMMUNITY): Payer: Self-pay | Admitting: Family Medicine

## 2022-04-16 DIAGNOSIS — Z720 Tobacco use: Secondary | ICD-10-CM

## 2023-04-16 ENCOUNTER — Other Ambulatory Visit (HOSPITAL_COMMUNITY): Payer: Self-pay | Admitting: Family Medicine

## 2023-04-16 DIAGNOSIS — Z720 Tobacco use: Secondary | ICD-10-CM

## 2023-05-01 ENCOUNTER — Encounter (HOSPITAL_COMMUNITY): Payer: Self-pay

## 2023-05-01 ENCOUNTER — Ambulatory Visit (HOSPITAL_COMMUNITY): Payer: Medicare PPO

## 2023-05-13 ENCOUNTER — Encounter: Payer: Self-pay | Admitting: Gastroenterology

## 2023-05-13 ENCOUNTER — Ambulatory Visit: Payer: Medicare PPO | Admitting: Gastroenterology

## 2023-05-13 VITALS — BP 128/85 | HR 73 | Temp 97.9°F | Ht 70.0 in | Wt 195.0 lb

## 2023-05-13 DIAGNOSIS — K219 Gastro-esophageal reflux disease without esophagitis: Secondary | ICD-10-CM | POA: Diagnosis not present

## 2023-05-13 DIAGNOSIS — R601 Generalized edema: Secondary | ICD-10-CM

## 2023-05-13 DIAGNOSIS — K7460 Unspecified cirrhosis of liver: Secondary | ICD-10-CM | POA: Diagnosis not present

## 2023-05-13 DIAGNOSIS — K7030 Alcoholic cirrhosis of liver without ascites: Secondary | ICD-10-CM

## 2023-05-13 DIAGNOSIS — Z860100 Personal history of colon polyps, unspecified: Secondary | ICD-10-CM

## 2023-05-13 DIAGNOSIS — F1011 Alcohol abuse, in remission: Secondary | ICD-10-CM | POA: Diagnosis not present

## 2023-05-13 MED ORDER — PANTOPRAZOLE SODIUM 40 MG PO TBEC: 40.0000 mg | DELAYED_RELEASE_TABLET | Freq: Every day | ORAL | 3 refills | Status: DC

## 2023-05-13 NOTE — Progress Notes (Signed)
GI Office Note    Referring Provider: Joana Reamer, DO Primary Care Physician:  Joana Reamer, DO  Primary Gastroenterologist: Roetta Sessions, MD   Chief Complaint   Chief Complaint  Patient presents with   Follow-up    Pt following up on cirrhosis. Pt states he is doing better     History of Present Illness   Clifford Hunt is a 62 y.o. male presenting today at the request of Dr. Mauri Reading for cirrhosis. Patient seen in 05/2020, for new patient appointment. He has history of alcoholism and decompensated cirrhosis (anasarca, requiring abdominal paracentesis in 2020). Unfortunately he was lost to follow-up.  Patient states that he does not remember coming here. Lost his memory from 2021 to prior. Was not aware that he should have returned for follow up. He has been sober since 03/2020. He gradually made his way back from severe illness, see prior ov note. He had lost down to 125 pounds when he was at his sickest. Off etoh, he has made a remarkable recovery. He feels well.  He has been having a lot of heartburn issues, almost daily. No dysphagia. No abdomianl pain. No constipation, diarrhea. No melnea, brbpr. Denies ASA powders. He does take mobic. No edema.   Did not tolerate spironolactone due to gynecomastia, has been on eplerenone instead. Also on furosemide.   Labs in 2021 showed mild anemia, mildly elevated IgA, mildly positive AMA, MELD of 6. ANA neg. Immune to hep B, hep A immune status unknown.  Colonoscopy and EGD dated May 24, 2019 performed at [facility]: Duodenal ulcer (benign biopsy with no H. pylori), moderate chronic gastritis (no H. pylori on pathology), distal esophagitis (benign biopsy), no esophageal varices.  Normal colon and rectum.  Due to prior history of colon polyps, recommended 5-year follow-up colonoscopy.  Medications   Current Outpatient Medications  Medication Sig Dispense Refill   atorvastatin (LIPITOR) 10 MG tablet Take  10 mg by mouth daily.     buPROPion (WELLBUTRIN XL) 150 MG 24 hr tablet Take 150 mg by mouth daily.     eplerenone (INSPRA) 25 MG tablet Take 25 mg by mouth daily.     escitalopram (LEXAPRO) 5 MG tablet Take 5 mg by mouth daily.     furosemide (LASIX) 20 MG tablet Take 20 mg by mouth daily.     gabapentin (NEURONTIN) 100 MG capsule Take 100 mg by mouth 3 (three) times daily.     meloxicam (MOBIC) 7.5 MG tablet Take 7.5 mg by mouth daily.     mirtazapine (REMERON) 15 MG tablet Take 1 tablet by mouth at bedtime.     Multiple Vitamin (MULTIVITAMIN WITH MINERALS) TABS tablet Take 1 tablet by mouth daily.     naltrexone (DEPADE) 50 MG tablet Take by mouth daily.     pantoprazole (PROTONIX) 40 MG tablet Take 1 tablet (40 mg total) by mouth daily before breakfast. 90 tablet 3   No current facility-administered medications for this visit.    Allergies   Allergies as of 05/13/2023 - Review Complete 05/13/2023  Allergen Reaction Noted   Morphine and codeine Shortness Of Breath 02/18/2017   Pork-derived products Hives 06/04/2020   Albumin (human) Hives, Itching, and Palpitations 04/09/2020    Past Medical History   Past Medical History:  Diagnosis Date   Anxiety    Chronic low back pain    Depression    Hypertension    Liver damage    Multinodular goiter  Past Surgical History   Past Surgical History:  Procedure Laterality Date   COLONOSCOPY  05/2019   [facility]: normal exam. due to h/o colon polyps, next colonoscopy 05/2024   ESOPHAGOGASTRODUODENOSCOPY  05/2019    [facility]:Duodenal ulcer (benign biopsy with no H. pylori), moderate chronic gastritis (no H. pylori on pathology), distal esophagitis (benign biopsy), no esophageal varices.    Past Family History   Family History  Problem Relation Age of Onset   Breast cancer Mother    Heart attack Father    Lupus Sister    Kidney disease Sister    Colon cancer Neg Hx    Liver disease Neg Hx     Past  Social History   Social History   Socioeconomic History   Marital status: Significant Other    Spouse name: Not on file   Number of children: Not on file   Years of education: Not on file   Highest education level: Not on file  Occupational History   Not on file  Tobacco Use   Smoking status: Every Day    Current packs/day: 1.00    Types: Cigarettes   Smokeless tobacco: Never   Tobacco comments:    1 pack/day  Substance and Sexual Activity   Alcohol use: Not Currently    Comment: 12 pack/day; 06/04/20 denied, quit 03/2020   Drug use: No   Sexual activity: Not on file  Other Topics Concern   Not on file  Social History Narrative   Not on file   Social Determinants of Health   Financial Resource Strain: Not on file  Food Insecurity: Not on file  Transportation Needs: Not on file  Physical Activity: Not on file  Stress: Not on file  Social Connections: Not on file  Intimate Partner Violence: Not on file    Review of Systems   General: Negative for anorexia, weight loss, fever, chills, fatigue, weakness. Eyes: Negative for vision changes.  ENT: Negative for hoarseness, difficulty swallowing , nasal congestion. CV: Negative for chest pain, angina, palpitations, dyspnea on exertion, peripheral edema.  Respiratory: Negative for dyspnea at rest, dyspnea on exertion, cough, sputum, wheezing.  GI: See history of present illness. GU:  Negative for dysuria, hematuria, urinary incontinence, urinary frequency, nocturnal urination.  MS: Negative for joint pain, +chronic low back pain.  Derm: Negative for rash or itching.  Neuro: Negative for weakness, abnormal sensation, seizure, frequent headaches, confusion. +memory loss prior to 2021. Psych: Negative for suicidal ideation, hallucinations. +anxiety/depression Endo: Negative for unusual weight change.  Heme: Negative for bruising or bleeding. Allergy: Negative for rash or hives.  Physical Exam   BP 128/85   Pulse 73    Temp 97.9 F (36.6 C)   Ht 5\' 10"  (1.778 m)   Wt 195 lb (88.5 kg)   BMI 27.98 kg/m    General: Well-nourished, well-developed in no acute distress.  Head: Normocephalic, atraumatic.   Eyes: Conjunctiva pink, no icterus. Mouth: Oropharyngeal mucosa moist and pink   Neck: Supple without thyromegaly, masses, or lymphadenopathy.  Lungs: Clear to auscultation bilaterally.  Heart: Regular rate and rhythm, no murmurs rubs or gallops.  Abdomen: Bowel sounds are normal, nondistended, no hepatosplenomegaly or masses,  no abdominal bruits or hernia, no rebound or guarding.  Mild epigastric tenderness Rectal: not performed Extremities: No lower extremity edema. No clubbing or deformities.  Neuro: Alert and oriented x 4 , grossly normal neurologically.  Skin: Warm and dry, no rash or jaundice.   Psych:  Alert and cooperative, normal mood and affect.  Labs   No recent labs available  Imaging Studies   No results found.  Assessment/Plan:   Cirrhosis: -presented with decompensated disease in 2021, anasarca -presumed due to prior etoh abuse, quit in 03/2020 -overdue for hepatoma screening and labs -to determine if esophageal variceal screening needed based on labs and u/s.  -congratulated him on his sobriety, needs to avoid all etoh. -previously had mildly elevated AMA and IgA, will check PBC profile along with IgA, check MELD, screen for HCV and check Hep A immune status  H/O colon polyps: -due surveillance 05/2024  GERD:  -start pantoprazole 40mg  daily before breakfast.     Leanna Battles. Melvyn Neth, MHS, PA-C [facility]

## 2023-05-13 NOTE — Patient Instructions (Signed)
Congratulations on your sobriety! Complete labs and ultrasound as scheduled.  Your labs need to be completed at a [facility] only due to specialized labs being ordered and not available elsewhere. We will be in touch with results as available. Start pantoprazole 40 mg daily before breakfast for acid reflux.  Prescription sent to your pharmacy.

## 2023-05-21 ENCOUNTER — Ambulatory Visit (HOSPITAL_COMMUNITY)
Admission: RE | Admit: 2023-05-21 | Discharge: 2023-05-21 | Disposition: A | Discharge status: Home / Self Care | Payer: Medicare PPO | Source: Ambulatory Visit | Attending: Gastroenterology | Admitting: Gastroenterology

## 2023-05-21 DIAGNOSIS — K7030 Alcoholic cirrhosis of liver without ascites: Secondary | ICD-10-CM | POA: Diagnosis present

## 2023-05-28 LAB — COMPREHENSIVE METABOLIC PANEL
ALT: 21 IU/L (ref 0–44)
AST: 13 [IU]/L (ref 0–40)
Albumin: 4.4 g/dL (ref 3.9–4.9)
Alkaline Phosphatase: 62 IU/L (ref 44–121)
BUN/Creatinine Ratio: 13 (ref 10–24)
BUN: 16 mg/dL (ref 8–27)
Bilirubin Total: <0.2 mg/dL (ref 0.0–1.2)
CO2: 25 mmol/L (ref 20–29)
Calcium: 9.5 mg/dL (ref 8.6–10.2)
Chloride: 101 mmol/L (ref 96–106)
Creatinine, Ser: 1.19 mg/dL (ref 0.76–1.27)
Globulin, Total: 2.5 g/dL (ref 1.5–4.5)
Glucose: 98 mg/dL (ref 70–99)
Potassium: 4 mmol/L (ref 3.5–5.2)
Sodium: 142 mmol/L (ref 134–144)
Total Protein: 6.9 g/dL (ref 6.0–8.5)
eGFR: 69 mL/min/{1.73_m2} (ref >59)

## 2023-05-28 LAB — CBC WITH DIFFERENTIAL/PLATELET
Basophils Absolute: 0 10*3/uL (ref 0.0–0.2)
Basos: 0 %
EOS (ABSOLUTE): 0.1 10*3/uL (ref 0.0–0.4)
Eos: 1 %
Hematocrit: 44.7 % (ref 37.5–51.0)
Hemoglobin: 15 g/dL (ref 13.0–17.7)
Immature Grans (Abs): 0.1 10*3/uL (ref 0.0–0.1)
Immature Granulocytes: 1 %
Lymphocytes Absolute: 2.6 10*3/uL (ref 0.7–3.1)
Lymphs: 35 %
MCH: 32.8 pg (ref 26.6–33.0)
MCHC: 33.6 g/dL (ref 31.5–35.7)
MCV: 98 fL — ABNORMAL HIGH (ref 79–97)
Monocytes Absolute: 0.5 10*3/uL (ref 0.1–0.9)
Monocytes: 7 %
Neutrophils Absolute: 4.1 10*3/uL (ref 1.4–7.0)
Neutrophils: 56 %
Platelets: 208 10*3/uL (ref 150–450)
RBC: 4.58 x10E6/uL (ref 4.14–5.80)
RDW: 12.1 % (ref 11.6–15.4)
WBC: 7.3 10*3/uL (ref 3.4–10.8)

## 2023-05-28 LAB — PRIMARY BILIARY CHOLANGITIS PR
Anti-GP-210 Ab (RDL): <20 U (ref <20)
Anti-Mitochondrial Ab by IFA: <1:20 {titer}
Anti-Mitochondrial M2 Ab (RDL): 45 U — ABNORMAL HIGH (ref <20)
Anti-Nuclear Ab by IFA (RDL): POSITIVE — AB
Anti-SP-100 Ab (RDL): <20 U (ref <20)
Anti-Smooth Muscle Ab by IFA: 1:20 {titer} — ABNORMAL HIGH

## 2023-05-28 LAB — ANA TITER AND PATTERN: Speckled Pattern: 1:40 {titer} — ABNORMAL HIGH

## 2023-05-28 LAB — PROTIME-INR
INR: 0.9 (ref 0.9–1.2)
Prothrombin Time: 10.7 s (ref 9.1–12.0)

## 2023-05-28 LAB — HEPATITIS C ANTIBODY: Hep C Virus Ab: NONREACTIVE

## 2023-05-28 LAB — IGG, IGA, IGM
IgA/Immunoglobulin A, Serum: 300 mg/dL (ref 61–437)
IgG (Immunoglobin G), Serum: 760 mg/dL (ref 603–1613)
IgM (Immunoglobulin M), Srm: 112 mg/dL (ref 20–172)

## 2023-05-28 LAB — AFP TUMOR MARKER: AFP, Serum, Tumor Marker: 2.8 ng/mL (ref 0.0–8.4)

## 2023-05-28 LAB — HEPATITIS A ANTIBODY, TOTAL: hep A Total Ab: NEGATIVE

## 2023-07-14 ENCOUNTER — Ambulatory Visit: Payer: Medicare PPO | Admitting: Orthopedic Surgery

## 2023-07-14 ENCOUNTER — Encounter: Payer: Self-pay | Admitting: Orthopedic Surgery

## 2023-07-14 ENCOUNTER — Other Ambulatory Visit (INDEPENDENT_AMBULATORY_CARE_PROVIDER_SITE_OTHER): Payer: Self-pay

## 2023-07-14 VITALS — BP 151/97 | HR 72 | Ht 70.0 in | Wt 198.0 lb

## 2023-07-14 DIAGNOSIS — M19071 Primary osteoarthritis, right ankle and foot: Secondary | ICD-10-CM

## 2023-07-14 DIAGNOSIS — M79671 Pain in right foot: Secondary | ICD-10-CM

## 2023-07-14 DIAGNOSIS — M19079 Primary osteoarthritis, unspecified ankle and foot: Secondary | ICD-10-CM

## 2023-07-14 MED ORDER — DOXYCYCLINE HYCLATE 100 MG PO TBEC: 100.0000 mg | DELAYED_RELEASE_TABLET | Freq: Two times a day (BID) | ORAL | 0 refills | Status: AC

## 2023-07-14 NOTE — Progress Notes (Signed)
 New Patient Visit  Assessment: Clifford Hunt is a 63 y.o. male with the following: 1. Pain in right foot 2. Arthritis of first metatarsophalangeal (MTP) joint  Plan: Clifford Hunt is some pain in the first MTP joint of the right foot.  No specific injury.  He does note that he removed a tick, approximately 6-7 months ago, around the time that he started to have pain.  Medications have not provided relief.  Radiographs demonstrate mild to moderate degenerative changes at the first MTP joint.  Pain could be a result of the mild to moderate arthritis of the first MTP joint, or could be secondary to a tick bite.  I recommended treatment with doxycycline , to see if this helps with the discomfort.  Otherwise, I recommended a stiff soled shoe, or carbon fiber insole.  Continue with NSAIDs, including indomethacin or ibuprofen as needed.  All questions were answered.  He states understanding.  He will follow-up as needed.  Follow-up: Return if symptoms worsen or fail to improve.  Subjective:  Chief Complaint  Patient presents with   Foot Pain    R great toe pain after removing a tick 6 mos ago. Pt states he's been having pain for the past 3-4 mos.     History of Present Illness: Clifford Hunt is a 63 y.o. male who has been referred by  [name], DO for evaluation of right great toe pain.  He states had pain in the great toe for several months.  He reports that approximately 6-7 months ago, he removed a tick from the into webspace, on the lateral aspect of the great toe.  He missed it.  Skin grew over it.  He was able to remove the tick.  He did not notice any associated redness or swelling.  However since then, he continues to have pain.  Pain is primarily located in the area of the first MTP joint.  This is tender to palpation.  Limited pain with range of motion.  He has tried prednisone, limited improvement of symptoms.  He has noticed some improvement with indomethacin.  Initially, he  was treated for gout.  He has not noticed any redness.  No history of gout.   Review of Systems: No fevers or chills No numbness or tingling No chest pain No shortness of breath No bowel or bladder dysfunction No GI distress No headaches   Medical History:  Past Medical History:  Diagnosis Date   Anxiety    Chronic low back pain    Depression    Hypertension    Liver damage    Multinodular goiter     Past Surgical History:  Procedure Laterality Date   COLONOSCOPY  05/2019   [facility]: normal exam. due to h/o colon polyps, next colonoscopy 05/2024   ESOPHAGOGASTRODUODENOSCOPY  05/2019    [facility]:Duodenal ulcer (benign biopsy with no H. pylori), moderate chronic gastritis (no H. pylori on pathology), distal esophagitis (benign biopsy), no esophageal varices.    Family History  Problem Relation Age of Onset   Breast cancer Mother    Heart attack Father    Lupus Sister    Kidney disease Sister    Colon cancer Neg Hx    Liver disease Neg Hx    Social History   Tobacco Use   Smoking status: Every Day    Current packs/day: 1.00    Types: Cigarettes   Smokeless tobacco: Never   Tobacco comments:    1 pack/day  Substance Use  Topics   Alcohol use: Not Currently    Comment: 12 pack/day; 06/04/20 denied, quit 03/2020   Drug use: No    Allergies  Allergen Reactions   Morphine And Codeine Shortness Of Breath   Pork-Derived Products Hives    Hives, throat swelling   Albumin  (Human) Hives, Itching and Palpitations    Current Meds  Medication Sig   doxycycline  (DORYX ) 100 MG EC tablet Take 1 tablet (100 mg total) by mouth 2 (two) times daily for 14 days.    Objective: BP (!) 151/97   Pulse 72   Ht 5' 10 (1.778 m)   Wt 198 lb (89.8 kg)   BMI 28.41 kg/m   Physical Exam:  General: Alert and oriented. and No acute distress. Gait: Normal gait.  Evaluation of the right foot demonstrates no swelling.  No redness.  There is a small,  healed lesion in the area of prior tick bite.  No tenderness in this area.  Toes warm and well-perfused.  He does have some decrease sensation all toes.  Tenderness palpation of the first MTP joint.  Limited pain with flexion or extension at the MTP joint.  IMAGING: I personally ordered and reviewed the following images  X-rays of the right foot were obtained in clinic today.  These are nonweightbearing views.  Slightly decreased joint space of the first MTP joint.  Small osteophytes are appreciated.  No additional injuries noted.  No soft tissue swelling.  No bony lesions.  Impression: Right foot x-rays with mild to moderate degenerative changes at the first MTP joint   New Medications:  Meds ordered this encounter  Medications   doxycycline  (DORYX ) 100 MG EC tablet    Sig: Take 1 tablet (100 mg total) by mouth 2 (two) times daily for 14 days.    Dispense:  28 tablet    Refill:  0      Oneil DELENA Horde, MD  07/14/2023 10:02 AM

## 2023-09-04 ENCOUNTER — Ambulatory Visit: Payer: Medicare PPO | Admitting: Gastroenterology

## 2023-09-12 NOTE — Progress Notes (Deleted)
 GI Office Note    Referring Provider: Joana Reamer, DO Primary Care Physician:  Joana Reamer, DO  Primary Gastroenterologist: Roetta Sessions, MD   Chief Complaint   No chief complaint on file.   History of Present Illness   Clifford Hunt is a 63 y.o. male presenting today for follow up. Last seen 05/2023 after being lost to follow up in 2021. H/o alcoholism and decompensated cirrhosis (anasarca, requiring abdominal paracentesis in 2020). History of oclon polyps due for surveillance colonoscopy in 05/2024. H/o GERD.   Patient has been sober since 03/2020. He lost his memory from prior to 2021. At his sickest he weighed 125 pounds.   Labs in 2021 showed mild anemia, mildly elevated IgA, mildly positive AMA, MELD of 6. ANA neg. Immune to hep B, hep A immune status unknown.  Abd u/s 05/2023 with no hepatoma, changes of cirrhosis.   Labs 05/2023: ANA positive, AMA2, ASMA all positive but ANA and ASMA low titer. Normal immunoglobulins. Not immune to Hep A. MELD 3.0 of 9. Spoke with [facility], no need for liver biopsy given normal LFTs, would not change treatment. AFP tumor marker 2.8.  Colonoscopy and EGD dated May 24, 2019 performed at [facility]: Duodenal ulcer (benign biopsy with no H. pylori), moderate chronic gastritis (no H. pylori on pathology), distal esophagitis (benign biopsy), no esophageal varices.  Normal colon and rectum.  Due to prior history of colon polyps, recommended 5-year follow-up colonoscopy.      Medications   Current Outpatient Medications  Medication Sig Dispense Refill   atorvastatin (LIPITOR) 10 MG tablet Take 10 mg by mouth daily.     buPROPion (WELLBUTRIN XL) 150 MG 24 hr tablet Take 150 mg by mouth daily.     eplerenone (INSPRA) 25 MG tablet Take 25 mg by mouth daily.     escitalopram (LEXAPRO) 5 MG tablet Take 5 mg by mouth daily.     furosemide (LASIX) 20 MG tablet Take 20 mg by mouth daily.     gabapentin  (NEURONTIN) 100 MG capsule Take 100 mg by mouth 3 (three) times daily.     meloxicam (MOBIC) 7.5 MG tablet Take 7.5 mg by mouth daily.     mirtazapine (REMERON) 15 MG tablet Take 1 tablet by mouth at bedtime.     Multiple Vitamin (MULTIVITAMIN WITH MINERALS) TABS tablet Take 1 tablet by mouth daily.     naltrexone (DEPADE) 50 MG tablet Take by mouth daily.     pantoprazole (PROTONIX) 40 MG tablet Take 1 tablet (40 mg total) by mouth daily before breakfast. 90 tablet 3   No current facility-administered medications for this visit.    Allergies   Allergies as of 09/14/2023 - Review Complete 07/14/2023  Allergen Reaction Noted   Morphine and codeine Shortness Of Breath 02/18/2017   Pork-derived products Hives 06/04/2020   Albumin (human) Hives, Itching, and Palpitations 04/09/2020     Past Medical History   Past Medical History:  Diagnosis Date   Anxiety    Chronic low back pain    Depression    Hypertension    Liver damage    Multinodular goiter     Past Surgical History   Past Surgical History:  Procedure Laterality Date   COLONOSCOPY  05/2019   [facility]: normal exam. due to h/o colon polyps, next colonoscopy 05/2024   ESOPHAGOGASTRODUODENOSCOPY  05/2019    [facility]:Duodenal ulcer (benign biopsy with no H. pylori), moderate chronic gastritis (  no H. pylori on pathology), distal esophagitis (benign biopsy), no esophageal varices.    Past Family History   Family History  Problem Relation Age of Onset   Breast cancer Mother    Heart attack Father    Lupus Sister    Kidney disease Sister    Colon cancer Neg Hx    Liver disease Neg Hx     Past Social History   Social History   Socioeconomic History   Marital status: Significant Other    Spouse name: Not on file   Number of children: Not on file   Years of education: Not on file   Highest education level: Not on file  Occupational History   Not on file  Tobacco Use   Smoking status:  Every Day    Current packs/day: 1.00    Types: Cigarettes   Smokeless tobacco: Never   Tobacco comments:    1 pack/day  Substance and Sexual Activity   Alcohol use: Not Currently    Comment: 12 pack/day; 06/04/20 denied, quit 03/2020   Drug use: No   Sexual activity: Not on file  Other Topics Concern   Not on file  Social History Narrative   Not on file   Social Drivers of Health   Financial Resource Strain: Not on file  Food Insecurity: Not on file  Transportation Needs: Not on file  Physical Activity: Not on file  Stress: Not on file  Social Connections: Not on file  Intimate Partner Violence: Not on file    Review of Systems   General: Negative for anorexia, weight loss, fever, chills, fatigue, weakness. ENT: Negative for hoarseness, difficulty swallowing , nasal congestion. CV: Negative for chest pain, angina, palpitations, dyspnea on exertion, peripheral edema.  Respiratory: Negative for dyspnea at rest, dyspnea on exertion, cough, sputum, wheezing.  GI: See history of present illness. GU:  Negative for dysuria, hematuria, urinary incontinence, urinary frequency, nocturnal urination.  Endo: Negative for unusual weight change.     Physical Exam   There were no vitals taken for this visit.   General: Well-nourished, well-developed in no acute distress.  Eyes: No icterus. Mouth: Oropharyngeal mucosa moist and pink , no lesions erythema or exudate. Lungs: Clear to auscultation bilaterally.  Heart: Regular rate and rhythm, no murmurs rubs or gallops.  Abdomen: Bowel sounds are normal, nontender, nondistended, no hepatosplenomegaly or masses,  no abdominal bruits or hernia , no rebound or guarding.  Rectal: ***  Extremities: No lower extremity edema. No clubbing or deformities. Neuro: Alert and oriented x 4   Skin: Warm and dry, no jaundice.   Psych: Alert and cooperative, normal mood and affect.  Labs   *** Imaging Studies   No results found.  Assessment        PLAN   ***   Leanna Battles. Melvyn Neth, MHS, PA-C [facility]

## 2023-09-14 ENCOUNTER — Ambulatory Visit: Payer: Medicare PPO | Admitting: Gastroenterology

## 2023-09-14 ENCOUNTER — Encounter: Payer: Self-pay | Admitting: Gastroenterology

## 2023-10-21 ENCOUNTER — Other Ambulatory Visit (HOSPITAL_COMMUNITY): Payer: Self-pay | Admitting: Family Medicine

## 2023-10-21 DIAGNOSIS — Z720 Tobacco use: Secondary | ICD-10-CM

## 2024-04-07 ENCOUNTER — Encounter (INDEPENDENT_AMBULATORY_CARE_PROVIDER_SITE_OTHER): Payer: Self-pay | Admitting: *Deleted

## 2024-06-03 ENCOUNTER — Other Ambulatory Visit: Payer: Self-pay | Admitting: Gastroenterology

## 2024-06-22 ENCOUNTER — Other Ambulatory Visit (HOSPITAL_COMMUNITY): Payer: Self-pay | Admitting: Family Medicine

## 2024-06-22 DIAGNOSIS — R200 Anesthesia of skin: Secondary | ICD-10-CM

## 2024-06-22 DIAGNOSIS — Z720 Tobacco use: Secondary | ICD-10-CM

## 2024-07-14 ENCOUNTER — Ambulatory Visit (HOSPITAL_COMMUNITY)

## 2024-07-28 ENCOUNTER — Ambulatory Visit (HOSPITAL_COMMUNITY)

## 2024-07-28 ENCOUNTER — Ambulatory Visit (HOSPITAL_COMMUNITY): Admission: RE | Admit: 2024-07-28 | Source: Ambulatory Visit

## 2024-07-29 ENCOUNTER — Encounter: Payer: Self-pay | Admitting: Gastroenterology

## 2024-07-29 ENCOUNTER — Ambulatory Visit: Admitting: Gastroenterology

## 2024-08-15 ENCOUNTER — Ambulatory Visit (HOSPITAL_COMMUNITY)
